# Patient Record
Sex: Male | Born: 1960 | Race: White | Hispanic: No | Marital: Single | Smoking: Never smoker
Health system: Southern US, Community
[De-identification: ages and names within clinical notes are randomized; demographics above are authoritative.]

## PROBLEM LIST (undated history)

## (undated) DIAGNOSIS — K219 Gastro-esophageal reflux disease without esophagitis: Secondary | ICD-10-CM

## (undated) DIAGNOSIS — I10 Essential (primary) hypertension: Secondary | ICD-10-CM

---

## 2008-04-13 HISTORY — PX: OTHER SURGICAL HISTORY: SHX169

## 2017-03-25 ENCOUNTER — Emergency Department (HOSPITAL_COMMUNITY)
Admission: EM | Admit: 2017-03-25 | Discharge: 2017-03-25 | Disposition: A | Discharge status: Other Acute Inpatient Hospital | Payer: Self-pay | Attending: Emergency Medicine | Admitting: Emergency Medicine

## 2017-03-25 ENCOUNTER — Inpatient Hospital Stay (HOSPITAL_COMMUNITY)
Admission: AD | Admit: 2017-03-25 | Discharge: 2017-03-28 | DRG: 897 | Disposition: A | Discharge status: Home / Self Care | Payer: Federal, State, Local not specified - Other | Attending: Psychiatry | Admitting: Psychiatry

## 2017-03-25 ENCOUNTER — Encounter (HOSPITAL_COMMUNITY): Payer: Self-pay

## 2017-03-25 ENCOUNTER — Other Ambulatory Visit: Payer: Self-pay

## 2017-03-25 DIAGNOSIS — Z951 Presence of aortocoronary bypass graft: Secondary | ICD-10-CM | POA: Diagnosis not present

## 2017-03-25 DIAGNOSIS — F332 Major depressive disorder, recurrent severe without psychotic features: Secondary | ICD-10-CM | POA: Diagnosis not present

## 2017-03-25 DIAGNOSIS — F419 Anxiety disorder, unspecified: Secondary | ICD-10-CM | POA: Diagnosis present

## 2017-03-25 DIAGNOSIS — R450 Nervousness: Secondary | ICD-10-CM | POA: Diagnosis not present

## 2017-03-25 DIAGNOSIS — I10 Essential (primary) hypertension: Secondary | ICD-10-CM | POA: Diagnosis present

## 2017-03-25 DIAGNOSIS — F39 Unspecified mood [affective] disorder: Secondary | ICD-10-CM | POA: Diagnosis not present

## 2017-03-25 DIAGNOSIS — I2510 Atherosclerotic heart disease of native coronary artery without angina pectoris: Secondary | ICD-10-CM | POA: Diagnosis present

## 2017-03-25 DIAGNOSIS — Z046 Encounter for general psychiatric examination, requested by authority: Secondary | ICD-10-CM | POA: Insufficient documentation

## 2017-03-25 DIAGNOSIS — G629 Polyneuropathy, unspecified: Secondary | ICD-10-CM | POA: Diagnosis present

## 2017-03-25 DIAGNOSIS — R45851 Suicidal ideations: Secondary | ICD-10-CM | POA: Diagnosis present

## 2017-03-25 DIAGNOSIS — F1024 Alcohol dependence with alcohol-induced mood disorder: Secondary | ICD-10-CM | POA: Diagnosis present

## 2017-03-25 DIAGNOSIS — G4700 Insomnia, unspecified: Secondary | ICD-10-CM | POA: Diagnosis present

## 2017-03-25 DIAGNOSIS — Z7982 Long term (current) use of aspirin: Secondary | ICD-10-CM | POA: Insufficient documentation

## 2017-03-25 DIAGNOSIS — Y908 Blood alcohol level of 240 mg/100 ml or more: Secondary | ICD-10-CM | POA: Diagnosis present

## 2017-03-25 DIAGNOSIS — Z79899 Other long term (current) drug therapy: Secondary | ICD-10-CM | POA: Insufficient documentation

## 2017-03-25 DIAGNOSIS — Z811 Family history of alcohol abuse and dependence: Secondary | ICD-10-CM | POA: Diagnosis not present

## 2017-03-25 DIAGNOSIS — F1010 Alcohol abuse, uncomplicated: Secondary | ICD-10-CM | POA: Diagnosis not present

## 2017-03-25 DIAGNOSIS — F10230 Alcohol dependence with withdrawal, uncomplicated: Secondary | ICD-10-CM | POA: Insufficient documentation

## 2017-03-25 DIAGNOSIS — F1020 Alcohol dependence, uncomplicated: Secondary | ICD-10-CM | POA: Diagnosis not present

## 2017-03-25 DIAGNOSIS — F322 Major depressive disorder, single episode, severe without psychotic features: Secondary | ICD-10-CM | POA: Diagnosis present

## 2017-03-25 HISTORY — DX: Essential (primary) hypertension: I10

## 2017-03-25 HISTORY — DX: Gastro-esophageal reflux disease without esophagitis: K21.9

## 2017-03-25 LAB — URINALYSIS, ROUTINE W REFLEX MICROSCOPIC
Bacteria, UA: NONE SEEN
Bilirubin Urine: NEGATIVE
GLUCOSE, UA: NEGATIVE mg/dL
Ketones, ur: NEGATIVE mg/dL
LEUKOCYTES UA: NEGATIVE
NITRITE: NEGATIVE
PH: 6 (ref 5.0–8.0)
Protein, ur: 100 mg/dL — AB
SPECIFIC GRAVITY, URINE: 1.013 (ref 1.005–1.030)

## 2017-03-25 LAB — COMPREHENSIVE METABOLIC PANEL
ALK PHOS: 72 U/L (ref 38–126)
ALT: 48 U/L (ref 17–63)
ANION GAP: 13 (ref 5–15)
AST: 73 U/L — ABNORMAL HIGH (ref 15–41)
Albumin: 4.1 g/dL (ref 3.5–5.0)
BILIRUBIN TOTAL: 1 mg/dL (ref 0.3–1.2)
BUN: 19 mg/dL (ref 6–20)
CALCIUM: 9.1 mg/dL (ref 8.9–10.3)
CO2: 28 mmol/L (ref 22–32)
Chloride: 97 mmol/L — ABNORMAL LOW (ref 101–111)
Creatinine, Ser: 1.09 mg/dL (ref 0.61–1.24)
GFR calc non Af Amer: >60 mL/min (ref >60)
Glucose, Bld: 99 mg/dL (ref 65–99)
Potassium: 4 mmol/L (ref 3.5–5.1)
SODIUM: 138 mmol/L (ref 135–145)
TOTAL PROTEIN: 8.3 g/dL — AB (ref 6.5–8.1)

## 2017-03-25 LAB — RAPID URINE DRUG SCREEN, HOSP PERFORMED
AMPHETAMINES: NOT DETECTED
Barbiturates: NOT DETECTED
Benzodiazepines: NOT DETECTED
Cocaine: NOT DETECTED
Opiates: NOT DETECTED
TETRAHYDROCANNABINOL: NOT DETECTED

## 2017-03-25 LAB — CBC WITH DIFFERENTIAL/PLATELET
Basophils Absolute: 0 10*3/uL (ref 0.0–0.1)
Basophils Relative: 0 %
EOS ABS: 0.1 10*3/uL (ref 0.0–0.7)
EOS PCT: 1 %
HCT: 42.3 % (ref 39.0–52.0)
HEMOGLOBIN: 14.5 g/dL (ref 13.0–17.0)
LYMPHS ABS: 2.6 10*3/uL (ref 0.7–4.0)
Lymphocytes Relative: 41 %
MCH: 29.6 pg (ref 26.0–34.0)
MCHC: 34.3 g/dL (ref 30.0–36.0)
MCV: 86.3 fL (ref 78.0–100.0)
MONOS PCT: 9 %
Monocytes Absolute: 0.6 10*3/uL (ref 0.1–1.0)
NEUTROS PCT: 49 %
Neutro Abs: 3.1 10*3/uL (ref 1.7–7.7)
Platelets: 157 10*3/uL (ref 150–400)
RBC: 4.9 MIL/uL (ref 4.22–5.81)
RDW: 16.5 % — ABNORMAL HIGH (ref 11.5–15.5)
WBC: 6.4 10*3/uL (ref 4.0–10.5)

## 2017-03-25 LAB — ETHANOL: ALCOHOL ETHYL (B): 321 mg/dL — AB (ref <10)

## 2017-03-25 MED ORDER — ONDANSETRON 4 MG PO TBDP
4.0000 mg | ORAL_TABLET | Freq: Once | ORAL | Status: AC
  Administered 2017-03-25: 4 mg via ORAL
  Filled 2017-03-25: qty 1

## 2017-03-25 MED ORDER — THIAMINE HCL 100 MG/ML IJ SOLN: 100.0000 mg | Freq: Every day | INTRAMUSCULAR | Status: DC

## 2017-03-25 MED ORDER — LORAZEPAM 1 MG PO TABS
0.0000 mg | ORAL_TABLET | Freq: Four times a day (QID) | ORAL | Status: DC
Start: 2017-03-25 — End: 2017-03-25
  Administered 2017-03-25: 2 mg via ORAL
  Administered 2017-03-25: 1 mg via ORAL
  Filled 2017-03-25: qty 1
  Filled 2017-03-25: qty 2

## 2017-03-25 MED ORDER — METOPROLOL TARTRATE 25 MG PO TABS
25.0000 mg | ORAL_TABLET | Freq: Two times a day (BID) | ORAL | Status: DC
  Administered 2017-03-25: 25 mg via ORAL
  Filled 2017-03-25 (×2): qty 1

## 2017-03-25 MED ORDER — LORAZEPAM 2 MG/ML IJ SOLN: 0.0000 mg | Freq: Four times a day (QID) | INTRAMUSCULAR | Status: DC

## 2017-03-25 MED ORDER — HYDROCHLOROTHIAZIDE 25 MG PO TABS: 25.0000 mg | ORAL_TABLET | Freq: Every day | ORAL | Status: DC

## 2017-03-25 MED ORDER — PRAVASTATIN SODIUM 20 MG PO TABS: 20.0000 mg | ORAL_TABLET | Freq: Every day | ORAL | Status: DC

## 2017-03-25 MED ORDER — GABAPENTIN 300 MG PO CAPS
300.0000 mg | ORAL_CAPSULE | Freq: Three times a day (TID) | ORAL | Status: DC
  Administered 2017-03-25 (×2): 300 mg via ORAL
  Filled 2017-03-25 (×2): qty 1

## 2017-03-25 MED ORDER — VITAMIN B-1 100 MG PO TABS
100.0000 mg | ORAL_TABLET | Freq: Every day | ORAL | Status: DC
  Administered 2017-03-25: 100 mg via ORAL
  Filled 2017-03-25: qty 1

## 2017-03-25 MED ORDER — TRAZODONE HCL 50 MG PO TABS: 50.0000 mg | ORAL_TABLET | Freq: Every evening | ORAL | Status: DC | PRN

## 2017-03-25 MED ORDER — ASPIRIN EC 81 MG PO TBEC: 81.0000 mg | DELAYED_RELEASE_TABLET | Freq: Every day | ORAL | Status: DC

## 2017-03-25 MED ORDER — LISINOPRIL 20 MG PO TABS: 20.0000 mg | ORAL_TABLET | Freq: Every day | ORAL | Status: DC

## 2017-03-25 MED ORDER — LORAZEPAM 1 MG PO TABS
0.0000 mg | ORAL_TABLET | Freq: Two times a day (BID) | ORAL | Status: DC
Start: 2017-03-27 — End: 2017-03-25

## 2017-03-25 MED ORDER — LISINOPRIL-HYDROCHLOROTHIAZIDE 20-25 MG PO TABS: 1.0000 | ORAL_TABLET | Freq: Every day | ORAL | Status: DC

## 2017-03-25 MED ORDER — LORAZEPAM 2 MG/ML IJ SOLN: 0.0000 mg | Freq: Two times a day (BID) | INTRAMUSCULAR | Status: DC

## 2017-03-25 NOTE — [facility] Counselor (Signed)
Pt states that he does not want to stay and needs to get home to his dog. He admits to calling his primary care doctor this AM and stating that he was suicidal with a plan to kill himself with a gun that he has at his home. Pt states that he doesn't feel suicidal anymore but does feel he needs help. He has recently relpased on alcohol for the past 2 weeks. Pt meets criteria for IVC due to the following suicide risk factors- age, race, access to a gun, stating suicidal ideation, substance abuse and living environment (pt lives alone). Dr. Adriana Simasook has been consulted about the pt and agrees to put IVC in place. Pt meets inpatient criteria per Nanine MeansJamison Lord NP.   Kateri PlummerKristin Timmothy Baranowski, LCAS, [facility] Lead Triage Specialist  [facility]  Therapeutic Triage Services Phone: [phone] Fax: [phone]

## 2017-03-25 NOTE — ED Notes (Signed)
Bed: WLPT4 Expected date:  Expected time:  Means of arrival:  Comments: 

## 2017-03-25 NOTE — ED Notes (Addendum)
Pt oriented to room and unit.  Pt is calm and cooperative.  He is pleasant.  Patient denies S/I and H/I and AVH.  Pt is showing no signs of withdrawal at this time.  15 minute checks and video monitoring in place.

## 2017-03-25 NOTE — ED Notes (Signed)
Patient will go back to psych ED when changed out.

## 2017-03-25 NOTE — ED Notes (Signed)
Pt sleeping at present, easily arouseable to verbal stimuli.  A&O x 3, monitoring for safety, Q 15 min checks in effect.  [facility] and [facility] after 9pm.

## 2017-03-25 NOTE — ED Notes (Signed)
Report called to RN Erskine SquibbJane, [facility].  Pending [employer] transport.

## 2017-03-25 NOTE — BH Assessment (Signed)
[facility] Assessment Progress Note  Per Nanine MeansJamison Lord, DNP, this pt requires psychiatric hospitalization.  Berneice Heinrichina Tate, RN, [facility]; they will be ready to receive pt at 21:00.  Pt presents under IVC initiated by EDP Donnetta HutchingBrian Cook, MD, and IVC documents have [facility].  Pt's nurse, Kendal Hymendie, has been notified, and agrees to call report to [phone].  Pt is to be transported via [employer] examiner[employer] enforcement.   Doylene Canninghomas Kendall Justo, MA Triage Specialist [phone]

## 2017-03-25 NOTE — ED Notes (Signed)
Date and time results received: 03/25/17 12:46 PM  Test: alcohol  Critical Value: 321  Name of Provider Notified: Adriana Simasook MD  Orders Received? Or Actions Taken?: awaiting orders

## 2017-03-25 NOTE — BH Assessment (Signed)
Assessment Note  Benjamin Richard is an 56 y.o. male  Who came in by police after calling his primary care doctor this AM and stating that he was suicidal with a plan to kill himself with a gun that he has at his home. Pt states that he doesn't feel suicidal anymore but does feel he needs help. Pt BAL is currently 321 but is coherent with pleasant affect. Pt states that he has recently relpased on alcohol for the past 2 weeks and has a history of alcoholism for years. Pt wanted to leave during assessment because he is worried about his dog being at home alone. Pt had to be IVCd by Dr. Adriana Simasook because he said he could "just walk out the door". Pt meets criteria for IVC due to the following suicide risk factors- age, race, access to a gun, stating suicidal ideation, substance abuse and living environment (pt lives alone).  Pt states that his current stressor [place]. Pt states that he lives alone in [place] and has no supports. 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 Pt states that he has trouble falling asleep and self medicates with alcohol during the week. He states that he used to go to AA but feels it makes him more depressed. Pt states that he would like to start going to outpatient counseling and build a support system.  Pt denies HI, AVH or any other psychiatric issues. He has never been admitted inpatient in the past but has gotten substance abuse treatment. Pt states that he wanted to get help sooner but doesn't have insurance until January 2019.   Nanine MeansJamison Lord NP recommends inpatient admission. IVC completed.   Diagnosis: F33.2 Major Depressive Disorder Single Episode Severe, F10.20 Alcohol Use Disorder Severe   Past Medical History: History reviewed. No pertinent past medical history.  History reviewed. No pertinent surgical history.  Family History: History reviewed. No  pertinent family history.  Social History:  reports that  has never smoked. he has never used smokeless tobacco. He reports that he drinks alcohol. He reports that he does not use drugs.  Additional Social History:  Alcohol / Drug Use History of alcohol / drug use?: Yes Substance #1 Name of Substance 1: ALCOHOL  1 - Age of First Use: UNKNOWN 1 - Amount (size/oz): 2 1/2 GALLONS A WEEK  1 - Frequency: DAILY  1 - Duration: 2 WEEKS  1 - Last Use / Amount: YESTERDAY   CIWA: CIWA-Ar BP: 137/82 Pulse Rate: 86 Nausea and Vomiting: no nausea and no vomiting Tactile Disturbances: none Tremor: no tremor Auditory Disturbances: not present Paroxysmal Sweats: no sweat visible Visual Disturbances: not present Anxiety: mildly anxious Headache, Fullness in Head: none present Agitation: normal activity Orientation and Clouding of Sensorium: oriented and can do serial additions CIWA-Ar Total: 1 COWS:    Allergies: Not on File  Home Medications:  (Not in a hospital admission)  OB/GYN Status:  No LMP for male patient.  General Assessment Data [facility] of Assessment: [facility] TTS Assessment: In system Is this a Tele or Face-to-Face Assessment?: Face-to-Face Is this an Initial Assessment or a Re-assessment for this encounter?: Initial Assessment Marital status: Long term relationship Is patient pregnant?: No Pregnancy Status: No Living Arrangements: Alone Can pt return to current living arrangement?: Yes Admission Status: Involuntary Is patient capable of signing voluntary admission?: No Referral Source: Self/Family/Friend Insurance type:  Self Pay      Crisis Care Plan Living Arrangements: Alone Legal Guardian: Mother  Education Status Is patient currently in school?: No  Risk to self with the past 6 months Suicidal Ideation: Yes-Currently Present Has patient been a risk to self within the past 6 months prior to admission? : Yes Suicidal Intent: Yes-Currently Present Has  patient had any suicidal intent within the past 6 months prior to admission? : Yes Is patient at risk for suicide?: Yes Suicidal Plan?: Yes-Currently Present Has patient had any suicidal plan within the past 6 months prior to admission? : Yes Specify Current Suicidal Plan: Pt has a gun at his home and stated that he was thinking of using it Access to Means: Yes Specify Access to Suicidal Means: pt has a gun at his home What has been your use of drugs/alcohol within the last 12 months?: uses alcohol  Previous Attempts/Gestures: No How many times?: 0 Other Self Harm Risks: NA Intentional Self Injurious Behavior: None Family Suicide History: Unable to assess Recent stressful life event(s): Other (Comment)(lives alone) Persecutory voices/beliefs?: No Depression: Yes Depression Symptoms: Despondent, Feeling worthless/self pity Substance abuse history and/or treatment for substance abuse?: No Suicide prevention information given to non-admitted patients: Yes  Risk to Others within the past 6 months Homicidal Ideation: No Does patient have any lifetime risk of violence toward others beyond the six months prior to admission? : No Thoughts of Harm to Others: No Current Homicidal Intent: No Current Homicidal Plan: No Access to Homicidal Means: No Identified Victim: None History of harm to others?: No Assessment of Violence: None Noted Violent Behavior Description: none Does patient have access to weapons?: No Criminal Charges Pending?: No Does patient have a court date: No Is patient on probation?: No  Psychosis Hallucinations: None noted Delusions: None noted  Mental Status Report Appearance/Hygiene: Unremarkable Eye Contact: Good Motor Activity: Freedom of movement Speech: Logical/coherent Level of Consciousness: Alert Mood: Pleasant Affect: Depressed Anxiety Level: Moderate Thought Processes: Coherent Judgement: Impaired Orientation: Person, Place, Time,  Situation Obsessive Compulsive Thoughts/Behaviors: None  Cognitive Functioning Concentration: Normal Memory: Recent Intact, Remote Intact IQ: Average Insight: Poor Impulse Control: Fair Appetite: Fair Weight Loss: 0 Weight Gain: 0 Sleep: No Change Total Hours of Sleep: 8 Vegetative Symptoms: None  ADLScreening [facility] Assessment Services) Patient's cognitive ability adequate to safely complete daily activities?: Yes Patient able to express need for assistance with ADLs?: Yes Independently performs ADLs?: Yes (appropriate for developmental age)  Prior Inpatient Therapy Prior Inpatient Therapy: No  Prior Outpatient Therapy Prior Outpatient Therapy: Yes Does patient have an ACCT team?: No Does patient have Intensive In-House Services?  : No Does patient have [facility] services? : No Does patient have P4CC services?: No  ADL Screening (condition at time of admission) Patient's cognitive ability adequate to safely complete daily activities?: Yes Is the patient deaf or have difficulty hearing?: No Does the patient have difficulty seeing, even when wearing glasses/contacts?: No Does the patient have difficulty concentrating, remembering, or making decisions?: No Patient able to express need for assistance with ADLs?: Yes Does the patient have difficulty dressing or bathing?: No Independently performs ADLs?: Yes (appropriate for developmental age) Does the patient have difficulty walking or climbing stairs?: No Weakness of Legs: None Weakness of Arms/Hands: None  Home Assistive Devices/Equipment Home Assistive Devices/Equipment: None  Therapy Consults (therapy consults require a physician order) PT Evaluation Needed: No OT Evalulation Needed: No SLP Evaluation Needed: No Abuse/Neglect Assessment (Assessment to be complete while patient is alone)  Abuse/Neglect Assessment Can Be Completed: Unable to assess, patient is non-responsive or altered mental status Values /  Beliefs Cultural Requests During Hospitalization: None Spiritual Requests During Hospitalization: None Consults Spiritual Care Consult Needed: No Social Work Consult Needed: No Merchant navy officerAdvance Directives (For Healthcare) Does Patient Have a Medical Advance Directive?: No Would patient like information on creating a medical advance directive?: No - Patient declined Nutrition Screen- [facility] Adult/[facility] Patient's home diet: Regular Has the patient recently lost weight without trying?: No Has the patient been eating poorly because of a decreased appetite?: No Malnutrition Screening Tool Score: 0  Additional Information 1:1 In Past 12 Months?: No CIRT Risk: No Elopement Risk: No Does patient have medical clearance?: Yes     Disposition:  Disposition Initial Assessment Completed for this Encounter: Yes Disposition of Patient: Inpatient treatment program Type of inpatient treatment program: Adult  On Site Evaluation by:  Donetta PottsKristin Estera Ozier LPC, LCAS Reviewed with Physician:  Nanine MeansJamison Lord NP   Lanice ShirtsKristin M Ariauna Farabee LPC, LCAS  03/25/2017 2:16 PM

## 2017-03-25 NOTE — ED Notes (Signed)
Pt vomited x 2.  Comfort measures given.  Report attempted x 1. [facility] to return call.

## 2017-03-25 NOTE — ED Triage Notes (Signed)
[place] reports Pt called nurse at MD's office with statements of suicidal ideation and depression, nurse then called PD for response and transport for Phych eval. PD reports no suicidal ideation upon interview, Pt stated he is seeking depression medication.

## 2017-03-25 NOTE — ED Notes (Signed)
Bed: [facility] Expected date:  Expected time:  Means of arrival:  Comments: ETOH 

## 2017-03-25 NOTE — ED Provider Notes (Signed)
[facility]-EMERGENCY DEPT Provider Note   CSN: [id number] Arrival date & time: 03/25/17  1007     History   Chief Complaint Chief Complaint  Patient presents with  . Suicidal    HPI Benjamin Richard is a 56 y.o. male past medical history of CAD, presenting to the ED with 3 weeks of worsening depression.  States symptoms acutely worsened for the past few days since he was stuck in the house due to the snow.  Reports the past few days he began feeling suicidal and called the suicidal hotline, who called [place] [employer] brought him here.  Patient endorses suicidal ideation with a plan to use a gun to end his life, which he "may or may not have" at home.  Auditory or visual hallucinations.  States he is an every day drinker of 6-7 liquor drinks, and drank today.  Denies smoking or illicit drug use.  No medical complaints today.  Seeking medication for his depression and plans to go home today to take care of his dog.  The history is provided by the patient.    History reviewed. No pertinent past medical history.  There are no active problems to display for this patient.   History reviewed. No pertinent surgical history.     Home Medications    Prior to Admission medications   Medication Sig Start Date End Date Taking? Authorizing Provider  aspirin EC 81 MG tablet Take 81 mg by mouth daily.   Yes [provider]  gabapentin (NEURONTIN) 300 MG capsule Take 300 mg by mouth 3 (three) times daily.   Yes [provider]  lisinopril-hydrochlorothiazide (PRINZIDE,ZESTORETIC) 20-25 MG tablet Take 1 tablet by mouth daily.   Yes [provider]  metoprolol tartrate (LOPRESSOR) 25 MG tablet Take 25 mg by mouth 2 (two) times daily.   Yes [provider]  pravastatin (PRAVACHOL) 20 MG tablet Take 20 mg by mouth daily.   Yes [provider]  traZODone (DESYREL) 50 MG tablet Take 50 mg by mouth at bedtime as needed for sleep.   Yes [provider]    Family History History reviewed. No pertinent family history.  Social History Social History   Tobacco Use  . Smoking status: Never Smoker  . Smokeless tobacco: Never Used  Substance Use Topics  . Alcohol use: Yes  . Drug use: No     Allergies   Patient has no allergy information on record.   Review of Systems Review of Systems  Constitutional: Negative for fever.  Psychiatric/Behavioral: Positive for dysphoric mood and suicidal ideas. Negative for hallucinations. The patient is not nervous/anxious.   All other systems reviewed and are negative.    Physical Exam Updated Vital Signs BP 137/82 (BP Location: Right Arm)   Pulse 86   Temp 98.4 F (36.9 C) (Oral)   Resp 14   Ht 6\' 5"  (1.956 m)   Wt 124.7 kg (275 lb)   SpO2 100%   BMI 32.61 kg/m   Physical Exam  Constitutional: He is oriented to person, place, and time. He appears well-developed and well-nourished. No distress.  Alcohol on breath, but clinically sober on exam. Ambulating without difficulty. Alert, following commands.  HENT:  Head: Normocephalic and atraumatic.  Mouth/Throat: Oropharynx is clear and moist.  Eyes: Conjunctivae are normal.  Cardiovascular: Normal rate, regular rhythm, normal heart sounds and intact distal pulses.  Pulmonary/Chest: Effort normal and breath sounds normal.  Abdominal: Soft. Bowel sounds are normal.  Neurological: He  is alert and oriented to person, place, and time.  Mental Status:  Alert, oriented, thought content appropriate, able to give a coherent history. Speech fluent without evidence of aphasia. Able to follow 2 step commands without difficulty.  Cranial Nerves:  II:  Peripheral visual fields grossly normal, pupils equal, round, reactive to light III,IV, VI: ptosis not present, extra-ocular motions intact bilaterally  V,VII: smile symmetric, facial light touch sensation equal VIII: hearing grossly normal to voice  X: uvula elevates symmetrically   XI: bilateral shoulder shrug symmetric and strong XII: midline tongue extension without fassiculations Motor:  Normal tone. 5/5 in upper and lower extremities bilaterally including strong and equal grip strength and dorsiflexion/plantar flexion Sensory: Pinprick and light touch normal in all extremities.  Deep Tendon Reflexes: 2+ and symmetric in the biceps and patella Cerebellar: normal finger-to-nose with bilateral upper extremities Gait: normal gait and balance CV: distal pulses palpable throughout    Skin: Skin is warm.  Psychiatric: He has a normal mood and affect. His speech is normal and behavior is normal. He is not actively hallucinating. He expresses suicidal ideation. He expresses no homicidal ideation. He expresses suicidal plans. He expresses no homicidal plans.  Nursing note and vitals reviewed.    ED Treatments / Results  Labs (all labs ordered are listed, but only abnormal results are displayed) Labs Reviewed  CBC WITH DIFFERENTIAL/PLATELET - Abnormal; Notable for the following components:      Result Value   RDW 16.5 (*)    All other components within normal limits  COMPREHENSIVE METABOLIC PANEL - Abnormal; Notable for the following components:   Chloride 97 (*)    Total Protein 8.3 (*)    AST 73 (*)    All other components within normal limits  URINALYSIS, ROUTINE W REFLEX MICROSCOPIC - Abnormal; Notable for the following components:   Hgb urine dipstick SMALL (*)    Protein, ur 100 (*)    Squamous Epithelial / LPF 0-5 (*)    All other components within normal limits  ETHANOL - Abnormal; Notable for the following components:   Alcohol, Ethyl (B) 321 (*)    All other components within normal limits  RAPID URINE DRUG SCREEN, HOSP PERFORMED    EKG  EKG Interpretation None       Radiology No results found.  Procedures Procedures (including critical care time)  Medications Ordered in ED Medications  LORazepam (ATIVAN) injection 0-4 mg (0 mg  Intravenous Hold 03/25/17 1350)    Or  LORazepam (ATIVAN) tablet 0-4 mg ( Oral See Alternative 03/25/17 1350)  LORazepam (ATIVAN) injection 0-4 mg (not administered)    Or  LORazepam (ATIVAN) tablet 0-4 mg (not administered)  thiamine (VITAMIN B-1) tablet 100 mg (not administered)    Or  thiamine (B-1) injection 100 mg (not administered)     Initial Impression / Assessment and Plan / ED Course  I have reviewed the triage vital signs and the nursing notes.  Pertinent labs & imaging results that were available during my care of the patient were reviewed by me and considered in my medical decision making (see chart for details).     Pt presenting with SI with plan, x 3 weeks. Hx of alcohol abuse, pt is everyday drinker of 6-7 liquor drinks per day. Denies illicit drug use. No medical complaints today. Normal neuro exam. Clinically sober on exam. Pt is medically cleared for TTS consult.  IVC issued, as pt requesting to leave but with suicide risk factors, including expressing SI  with plan to use a gun which he has access to at home. Pt also intoxicated.  Patient discussed with and seen by Dr. Adriana Simasook  Final Clinical Impressions(s) / ED Diagnoses   Final diagnoses:  Suicidal ideation    ED Discharge Orders    None       Adryan Shin, [place] N, PA-C 03/25/17 1359    Donnetta Hutchingook, Brian, MD 03/26/17 1150

## 2017-03-25 NOTE — Patient Outreach (Signed)
ED Peer Support Specialist Patient Intake (Complete at intake & 30-60 Day Follow-up)  Name: Benjamin Richard  MRN: [id number]  Age: 56 y.o.   Date of Admission: 03/25/2017  Intake: Initial Comments:      Primary Reason Admitted: IVC, SI, alcohol use  Lab values: Alcohol/ETOH: Positive Positive UDS? No Amphetamines:   Barbiturates:   Benzodiazepines:   Cocaine:   Opiates:   Cannabinoids:    Demographic information: Gender: Male Ethnicity: White Marital Status: Other (comment)(Long-term relationship) Insurance Status: Uninsured/Self-pay(Will have insurance through his job starting in January.) Control and instrumentation engineereceives non-medical governmental assistance (Work Engineer, agriculturalirst/Welfare, Sales executivefood stamps, etc.: No Lives with: Alone Living situation: House/Apartment  Reported Patient History: Patient reported health conditions: Depression Patient aware of HIV and hepatitis status: Yes (comment)(Negative)  In past year, has patient visited ED for any reason? No  Number of ED visits:    Reason(s) for visit:    In past year, has patient been hospitalized for any reason? No  Number of hospitalizations:    Reason(s) for hospitalization:    In past year, has patient been arrested? No  Number of arrests:    Reason(s) for arrest:    In past year, has patient been incarcerated? No  Number of incarcerations:    Reason(s) for incarceration:    In past year, has patient received medication-assisted treatment? No  In past year, patient received the following treatments: Peer support group(AA)  In past year, has patient received any harm reduction services? No  Did this include any of the following?    In past year, has patient received care from a mental health provider for diagnosis other than SUD? No  In past year, is this first time patient has overdosed? (Has not overdosed)  Number of past overdoses:    In past year, is this first time patient has been hospitalized for an overdose? (has not  overdosed)  Number of hospitalizations for overdose(s):    Is patient currently receiving treatment for a mental health diagnosis? No  Patient reports experiencing difficulty participating in SUD treatment: No    Most important reason(s) for this difficulty?    Has patient received prior services for treatment? No  In past, patient has received services from following agencies:    Plan of Care:  Suggested follow up at these agencies/treatment centers: Other (comment)(Patient is interested in more of outpatient for substance use treatment. Patient wants more of an individual approach for substance use treatment. [facility] will provide resource of [facility]. )  Other information:    Bartholomew BoardsJohn Josiane Labine, CPSS  03/25/2017 4:02 PM

## 2017-03-26 ENCOUNTER — Other Ambulatory Visit: Payer: Self-pay

## 2017-03-26 ENCOUNTER — Encounter (HOSPITAL_COMMUNITY): Payer: Self-pay | Admitting: *Deleted

## 2017-03-26 DIAGNOSIS — F332 Major depressive disorder, recurrent severe without psychotic features: Secondary | ICD-10-CM

## 2017-03-26 DIAGNOSIS — Z811 Family history of alcohol abuse and dependence: Secondary | ICD-10-CM

## 2017-03-26 DIAGNOSIS — F1010 Alcohol abuse, uncomplicated: Secondary | ICD-10-CM

## 2017-03-26 MED ORDER — HYDROXYZINE HCL 25 MG PO TABS
25.0000 mg | ORAL_TABLET | Freq: Four times a day (QID) | ORAL | Status: DC | PRN
  Filled 2017-03-26: qty 10

## 2017-03-26 MED ORDER — LORAZEPAM 1 MG PO TABS: 1.0000 mg | ORAL_TABLET | Freq: Every day | ORAL | Status: DC

## 2017-03-26 MED ORDER — PRAVASTATIN SODIUM 10 MG PO TABS
20.0000 mg | ORAL_TABLET | Freq: Every day | ORAL | Status: DC
  Administered 2017-03-26 – 2017-03-28 (×3): 20 mg via ORAL
  Filled 2017-03-26 (×2): qty 1
  Filled 2017-03-26 (×2): qty 2
  Filled 2017-03-26: qty 1
  Filled 2017-03-26: qty 2

## 2017-03-26 MED ORDER — VITAMIN B-1 100 MG PO TABS
100.0000 mg | ORAL_TABLET | Freq: Every day | ORAL | Status: DC
  Administered 2017-03-26 – 2017-03-28 (×3): 100 mg via ORAL
  Filled 2017-03-26 (×5): qty 1

## 2017-03-26 MED ORDER — ONDANSETRON 4 MG PO TBDP
4.0000 mg | ORAL_TABLET | Freq: Four times a day (QID) | ORAL | Status: DC | PRN
  Administered 2017-03-26 (×2): 4 mg via ORAL
  Filled 2017-03-26 (×2): qty 1

## 2017-03-26 MED ORDER — TRAZODONE HCL 50 MG PO TABS
50.0000 mg | ORAL_TABLET | Freq: Every evening | ORAL | Status: DC | PRN
  Administered 2017-03-26 – 2017-03-27 (×3): 50 mg via ORAL
  Filled 2017-03-26 (×2): qty 1
  Filled 2017-03-26: qty 7
  Filled 2017-03-26: qty 1

## 2017-03-26 MED ORDER — LORAZEPAM 2 MG/ML IJ SOLN: 0.0000 mg | Freq: Two times a day (BID) | INTRAMUSCULAR | Status: DC

## 2017-03-26 MED ORDER — ASPIRIN EC 81 MG PO TBEC
81.0000 mg | DELAYED_RELEASE_TABLET | Freq: Every day | ORAL | Status: DC
  Administered 2017-03-26 – 2017-03-28 (×3): 81 mg via ORAL
  Filled 2017-03-26 (×5): qty 1

## 2017-03-26 MED ORDER — MAGNESIUM HYDROXIDE 400 MG/5ML PO SUSP: 30.0000 mL | Freq: Every day | ORAL | Status: DC | PRN

## 2017-03-26 MED ORDER — GABAPENTIN 100 MG PO CAPS
100.0000 mg | ORAL_CAPSULE | Freq: Three times a day (TID) | ORAL | Status: DC
  Administered 2017-03-26 – 2017-03-27 (×2): 100 mg via ORAL
  Filled 2017-03-26 (×5): qty 1

## 2017-03-26 MED ORDER — HYDROCHLOROTHIAZIDE 12.5 MG PO CAPS
12.5000 mg | ORAL_CAPSULE | Freq: Two times a day (BID) | ORAL | Status: DC
  Administered 2017-03-26 – 2017-03-28 (×5): 12.5 mg via ORAL
  Filled 2017-03-26 (×11): qty 1

## 2017-03-26 MED ORDER — LORAZEPAM 1 MG PO TABS
1.0000 mg | ORAL_TABLET | Freq: Two times a day (BID) | ORAL | Status: DC
  Filled 2017-03-26 (×2): qty 1

## 2017-03-26 MED ORDER — LORAZEPAM 1 MG PO TABS
1.0000 mg | ORAL_TABLET | Freq: Four times a day (QID) | ORAL | Status: DC | PRN
  Administered 2017-03-26: 1 mg via ORAL
  Filled 2017-03-26: qty 1

## 2017-03-26 MED ORDER — GABAPENTIN 300 MG PO CAPS
300.0000 mg | ORAL_CAPSULE | Freq: Three times a day (TID) | ORAL | Status: DC
  Administered 2017-03-26 (×2): 300 mg via ORAL
  Filled 2017-03-26 (×7): qty 1

## 2017-03-26 MED ORDER — LORAZEPAM 1 MG PO TABS: 0.0000 mg | ORAL_TABLET | Freq: Two times a day (BID) | ORAL | Status: DC

## 2017-03-26 MED ORDER — LORAZEPAM 1 MG PO TABS
1.0000 mg | ORAL_TABLET | Freq: Four times a day (QID) | ORAL | Status: AC
  Administered 2017-03-26 (×4): 1 mg via ORAL
  Filled 2017-03-26 (×4): qty 1

## 2017-03-26 MED ORDER — ACETAMINOPHEN 325 MG PO TABS: 650.0000 mg | ORAL_TABLET | Freq: Four times a day (QID) | ORAL | Status: DC | PRN

## 2017-03-26 MED ORDER — METOPROLOL TARTRATE 25 MG PO TABS
25.0000 mg | ORAL_TABLET | Freq: Two times a day (BID) | ORAL | Status: DC
  Administered 2017-03-26 – 2017-03-28 (×5): 25 mg via ORAL
  Filled 2017-03-26 (×9): qty 1

## 2017-03-26 MED ORDER — LOPERAMIDE HCL 2 MG PO CAPS: 2.0000 mg | ORAL_CAPSULE | ORAL | Status: DC | PRN

## 2017-03-26 MED ORDER — LORAZEPAM 1 MG PO TABS: 0.0000 mg | ORAL_TABLET | Freq: Four times a day (QID) | ORAL | Status: DC

## 2017-03-26 MED ORDER — THIAMINE HCL 100 MG/ML IJ SOLN: 100.0000 mg | Freq: Every day | INTRAMUSCULAR | Status: DC

## 2017-03-26 MED ORDER — LORAZEPAM 2 MG/ML IJ SOLN: 0.0000 mg | Freq: Four times a day (QID) | INTRAMUSCULAR | Status: DC

## 2017-03-26 MED ORDER — ALUM & MAG HYDROXIDE-SIMETH 200-200-20 MG/5ML PO SUSP: 30.0000 mL | ORAL | Status: DC | PRN

## 2017-03-26 MED ORDER — LORAZEPAM 1 MG PO TABS
1.0000 mg | ORAL_TABLET | Freq: Three times a day (TID) | ORAL | Status: AC
  Administered 2017-03-27 (×3): 1 mg via ORAL
  Filled 2017-03-26 (×3): qty 1

## 2017-03-26 MED ORDER — LISINOPRIL 10 MG PO TABS
10.0000 mg | ORAL_TABLET | Freq: Two times a day (BID) | ORAL | Status: DC
  Administered 2017-03-26 – 2017-03-28 (×5): 10 mg via ORAL
  Filled 2017-03-26 (×5): qty 1
  Filled 2017-03-26: qty 2
  Filled 2017-03-26 (×4): qty 1

## 2017-03-26 NOTE — Plan of Care (Signed)
Patient has not engaged in self harm, denies SI.   Patient verbalizes understanding of information, education provided.

## 2017-03-26 NOTE — Progress Notes (Signed)
D: When asked about his day pt stated he had a "good day", held out his arms so writer could see he didn't have tremors. When asked if he had a chance to speak to the doctor, pt stated, "I think they're keeping me too long". Stated, he was told he may leave Mon or Tues.  Stated, "I haven't needed zofran today". Pt has no questions or concerns.    A:  Support and encouragement was offered. 15 min checks continued for safety.  R: Pt remains safe.

## 2017-03-26 NOTE — [facility] Suicide Risk Assessment (Signed)
[facility]   Nursing information obtained from:  Patient Demographic factors:  Male, Caucasian, Access to firearms, Divorced or widowed, Living alone Current Mental Status:  NA Loss Factors:  Financial problems / change in socioeconomic status, Loss of significant relationship Historical Factors:  NA Risk Reduction Factors:  Sense of responsibility to family, Employed, Religious beliefs about death  Total Time spent with patient: 45 minutes Principal Problem:  Alcohol Dependence, Alcohol Induced Mood Disorder, Depressed  Diagnosis:   Patient Active Problem List   Diagnosis Date Noted  . Alcohol dependence (HCC) [F10.20] 03/25/2017  . Major depressive disorder, recurrent severe without psychotic features (HCC) [F33.2] 03/25/2017    Continued Clinical Symptoms:  Alcohol Use Disorder Identification Test Final Score (AUDIT): 25 The "Alcohol Use Disorders Identification Test", Guidelines for Use in Primary Care, Second Edition.  World Science writerHealth Organization [facility](WHO). Score between 0-7:  no or low risk or alcohol related problems. Score between 8-15:  moderate risk of alcohol related problems. Score between 16-19:  high risk of alcohol related problems. Score 20 or above:  warrants further diagnostic evaluation for alcohol dependence and treatment.   CLINICAL FACTORS:  [age] old male, divorced, lives alone, employed , has two adult step children. Recently relocated due to employment about 2-3 months ago, which has been challenging in that his GF is no longer geographically close to him.  Reports he called a suicide hotline due to worsening depression and suicidal ideations , with thoughts of shooting self . The police were called and was brought to ED yesterday. Describes some neuro-vegetative symptoms- decreased appetite, some anhedonia. Denies any hallucinations or psychotic symptoms. Patient reports heavy , daily alcohol consumption- admission BAL 321, UDS negative  .  He reports a history of alcohol use disorder, had relapsed about two months ago after a period of several months of sobriety. States he drinks close to two gallons of liquor per week. Denies any drug abuse . Denies history of DTs or WDL seizures. No history of blackouts, DUI x 1 in 2015.   One prior psychiatric admission for alcohol dependence and detox . He has never attempted suicide. Denies history of mania, denies history of psychosis.  Medical History of HTN, states he was told he had DM in the past, but serial HgbA1Cs have been WNL. History of CAD, S/P bypass surgery   Father deceased from pancreatic cancer, he had history of alcohol dependence, mother alive . No history of mental illness or of su  Dx- Alcohol Dependence, Alcohol Induced Mood Disorder, Depressed   Plan- Inpatient admission. Start Ativan detox protocol to address alcohol withdrawal ( reports he has been on Librium and on Ativan for detox in the past, prefers the latter ) . We discussed starting antidepressant medication - prefers to monitor mood in the context of sobriety and consider antidepressant if mood does not continue to improve with sobriety.     Musculoskeletal: Strength & Muscle Tone: within normal limits mild distal tremors and facial flushing , no visual disturbances,no psychomotor agitation or restlessness  Gait & Station: normal Patient leans: N/A  Psychiatric Specialty Exam: Physical Exam  ROS denies headache , no chest pain, no shortness of breath, no vomiting, no melenas .  Blood pressure (!) 150/102, pulse (!) 105, temperature 98 F (36.7 C), resp. rate 16, height 6\' 5"  (1.956 m), weight 122 kg (269 lb).Body mass index is 31.9 kg/m.  General Appearance: Fairly Groomed  Eye Contact:  Good  Speech:  Normal Rate  Volume:  Normal  Mood:  states he is feeling better, and describes " I feel a lot better today"  Affect:  appropriate, reactive   Thought Process:  Linear and Descriptions of  Associations: Intact  Orientation:  Other:  fully alert and attentive   Thought Content:  denies hallucinations, no delusions, not internally preoccupied   Suicidal Thoughts:  No denies suicidal or self injurious ideations at this time, denies homicidal or violent ideations  Homicidal Thoughts:  No  Memory:  recent and remote grossly intact   Judgement:  Other:  improving  Insight:  fair- improving   Psychomotor Activity:  Normal- no restlessness or agitation, mild distal tremors   Concentration:  Concentration: Good and Attention Span: Good  Recall:  Good  Fund of Knowledge:  Good  Language:  Good  Akathisia:  Negative  Handed:  Right  AIMS (if indicated):     Assets:  Communication Skills Desire for Improvement Resilience  ADL's:  Intact  Cognition:  WNL  Sleep:  Number of Hours: 3.25      COGNITIVE FEATURES THAT CONTRIBUTE TO RISK:  Closed-mindedness and Loss of executive function    SUICIDE RISK:   Moderate:  Frequent suicidal ideation with limited intensity, and duration, some specificity in terms of plans, no associated intent, good self-control, limited dysphoria/symptomatology, some risk factors present, and identifiable protective factors, including available and accessible social support.  PLAN OF CARE: Patient will be admitted to inpatient psychiatric unit for stabilization and safety. Will provide and encourage milieu participation. Provide medication management and maked adjustments as needed.  Will also provide medication management to address potential alcohol WDL. Will follow daily.    I certify that inpatient services furnished can reasonably be expected to improve the patient's condition.   Craige CottaFernando A Cobos, MD Sep 09, 202018, 1:50 PM

## 2017-03-26 NOTE — Tx Team (Signed)
Interdisciplinary Treatment and Diagnostic Plan Update  05/16/202018 Time of Session: [name] MRN: [id number]  Principal Diagnosis: Major Depressive Disorder, recurrent, severe without psychotic features  Secondary Diagnoses: Active Problems:   Major depressive disorder, recurrent severe without psychotic features ([place])   Current Medications:  Current Facility-Administered Medications  Medication Dose Route Frequency Provider Last Rate Last Dose  . acetaminophen (TYLENOL) tablet 650 mg  650 mg Oral Q6H PRN Patrecia Pour, NP      . alum & mag hydroxide-simeth (MAALOX/MYLANTA) 200-200-20 MG/5ML suspension 30 mL  30 mL Oral Q4H PRN Patrecia Pour, NP      . aspirin EC tablet 81 mg  81 mg Oral Daily Lord, Jamison Y, NP      . gabapentin (NEURONTIN) capsule 300 mg  300 mg Oral TID Patrecia Pour, NP      . hydrOXYzine (ATARAX/VISTARIL) tablet 25 mg  25 mg Oral Q6H PRN Lindon Romp A, NP      . loperamide (IMODIUM) capsule 2-4 mg  2-4 mg Oral PRN Lindon Romp A, NP      . LORazepam (ATIVAN) tablet 1 mg  1 mg Oral Q6H PRN Lindon Romp A, NP   1 mg at 03/26/17 0158  . LORazepam (ATIVAN) tablet 1 mg  1 mg Oral QID Rozetta Nunnery, NP       Followed by  . [START ON 03/27/2017] LORazepam (ATIVAN) tablet 1 mg  1 mg Oral TID Rozetta Nunnery, NP       Followed by  . [START ON 03/28/2017] LORazepam (ATIVAN) tablet 1 mg  1 mg Oral BID Rozetta Nunnery, NP       Followed by  . [START ON 03/29/2017] LORazepam (ATIVAN) tablet 1 mg  1 mg Oral Daily Lindon Romp A, NP      . magnesium hydroxide (MILK OF MAGNESIA) suspension 30 mL  30 mL Oral Daily PRN Patrecia Pour, NP      . metoprolol tartrate (LOPRESSOR) tablet 25 mg  25 mg Oral BID Patrecia Pour, NP      . ondansetron (ZOFRAN-ODT) disintegrating tablet 4 mg  4 mg Oral Q6H PRN Lindon Romp A, NP   4 mg at 03/26/17 0158  . pravastatin (PRAVACHOL) tablet 20 mg  20 mg Oral Daily Lord, Jamison Y, NP      . thiamine (VITAMIN B-1) tablet 100  mg  100 mg Oral Daily Patrecia Pour, NP       Or  . thiamine (B-1) injection 100 mg  100 mg Intravenous Daily Waylan Boga Y, NP      . traZODone (DESYREL) tablet 50 mg  50 mg Oral QHS PRN Patrecia Pour, NP   50 mg at 03/26/17 0157   PTA Medications: Medications Prior to Admission  Medication Sig Dispense Refill Last Dose  . aspirin EC 81 MG tablet Take 81 mg by mouth daily.   03/25/2017 at Unknown time  . gabapentin (NEURONTIN) 300 MG capsule Take 300 mg by mouth 3 (three) times daily.   03/25/2017 at Unknown time  . lisinopril-hydrochlorothiazide (PRINZIDE,ZESTORETIC) 20-25 MG tablet Take 1 tablet by mouth daily.   03/25/2017 at Unknown time  . metoprolol tartrate (LOPRESSOR) 25 MG tablet Take 25 mg by mouth 2 (two) times daily.   03/25/2017 at Unknown time  . traZODone (DESYREL) 50 MG tablet Take 50 mg by mouth at bedtime as needed for sleep.   03/25/2017 at Unknown time  . pravastatin (PRAVACHOL) 20  MG tablet Take 20 mg by mouth daily.   Unknown at Unknown time    Patient Stressors: Financial difficulties Loss of girlfriend still in hometown Substance abuse  Patient Strengths: Ability for insight Active sense of humor Average or above average intelligence Capable of independent living Occupational psychologist fund of knowledge Motivation for treatment/growth Physical Health Religious Affiliation Special hobby/interest Supportive family/friends Work skills  Treatment Modalities: Medication Management, Group therapy, Case management,  1 to 1 session with clinician, Psychoeducation, Recreational therapy.   Physician Treatment Plan for Primary Diagnosis: Major Depressive Disorder, recurrent, severe without psychotic features  Medication Management: Evaluate patient's response, side effects, and tolerance of medication regimen.  Therapeutic Interventions: 1 to 1 sessions, Unit Group sessions and Medication administration.  Evaluation of Outcomes:  Not Met  Physician Treatment Plan for Secondary Diagnosis: Active Problems:   Major depressive disorder, recurrent severe without psychotic features ([place])  Medication Management: Evaluate patient's response, side effects, and tolerance of medication regimen.  Therapeutic Interventions: 1 to 1 sessions, Unit Group sessions and Medication administration.  Evaluation of Outcomes: Not Met   RN Treatment Plan for Primary Diagnosis: Major Depressive Disorder, recurrent, severe without psychotic features Long Term Goal(s): Knowledge of disease and therapeutic regimen to maintain health will improve  Short Term Goals: Ability to remain free from injury will improve, Ability to verbalize feelings will improve, Ability to disclose and discuss suicidal ideas and Ability to identify and develop effective coping behaviors will improve  Medication Management: RN will administer medications as ordered by provider, will assess and evaluate patient's response and provide education to patient for prescribed medication. RN will report any adverse and/or side effects to prescribing provider.  Therapeutic Interventions: 1 on 1 counseling sessions, Psychoeducation, Medication administration, Evaluate responses to treatment, Monitor vital signs and CBGs as ordered, Perform/monitor CIWA, COWS, AIMS and Fall Risk screenings as ordered, Perform wound care treatments as ordered.  Evaluation of Outcomes: Progressing   LCSW Treatment Plan for Primary Diagnosis: Major Depressive Disorder, recurrent, severe without psychotic features Long Term Goal(s): Safe transition to appropriate next level of care at discharge, Engage patient in therapeutic group addressing interpersonal concerns.  Short Term Goals: Engage patient in aftercare planning with referrals and resources, Facilitate patient progression through stages of change regarding substance use diagnoses and concerns and Identify triggers associated with mental  health/substance abuse issues  Therapeutic Interventions: Assess for all discharge needs, 1 to 1 time with Social worker, Explore available resources and support systems, Assess for adequacy in community support network, Educate family and significant other(s) on suicide prevention, Complete Psychosocial Assessment, Interpersonal group therapy.  Evaluation of Outcomes: Progressing   Progress in Treatment: Attending groups: No. New to unit. Continuing to assess.  Participating in groups: No. Taking medication as prescribed: Yes. Toleration medication: Yes. Family/Significant other contact made: Yes, pt's girlfriend contacted for SPE and for collateral information.  Patient understands diagnosis: Yes. Discussing patient identified problems/goals with staff: Yes. Medical problems stabilized or resolved: Yes. Denies suicidal/homicidal ideation: Yes. Issues/concerns per patient self-inventory: No. Other: n/a   New problem(s) identified: No, Describe:  n/a  New Short Term/Long Term Goal(s): elimination of SI thoughts; detox, medication management for mood stabilization, development of comprehensive mental wellness/sobriety plan.   Discharge Plan or Barriers: CSW assessing--Pt plans to follow-up at [facility] until insurance kicks in early next month. Pt given [facility] information if he wants to change services at that point. He declined [facility] and residential referrals at this time due to  his need to return to work (12 hour shifts). [place] pamphlet also provided for additional community supports.   Reason for Continuation of Hospitalization: Anxiety Depression Medication stabilization Suicidal ideation Withdrawal symptoms  Estimated Length of Stay: Tuesday, 03/30/17  Attendees: Patient: 03/01/2017 7:56 AM  Physician: Dr. Parke Poisson MD; Dr. Nancy Fetter MD 03/01/2017 7:56 AM  Nursing: Niel Hummer RN 03/01/2017 7:56 AM  RN Care Manager:x 03/01/2017 7:56 AM  Social Worker: Press photographer, LCSW  03/01/2017 7:56 AM  Recreational Therapist: x 03/01/2017 7:56 AM  Other: Lindell Spar NP; Darnelle Maffucci Money NP 03/01/2017 7:56 AM  Other:  03/01/2017 7:56 AM  Other: 03/01/2017 7:56 AM    Scribe for Treatment Team: [place], LCSW 03/01/2017 7:56 AM

## 2017-03-26 NOTE — H&P (Signed)
Psychiatric Admission Assessment Adult  Patient Identification: Benjamin Richard MRN:  [id number] Date of Evaluation:  04/07/202018 Chief Complaint:  MDD single episode severe  alcohol use disorder  Principal Diagnosis: Major depressive disorder, recurrent severe without psychotic features ([place]) Diagnosis:   Patient Active Problem List   Diagnosis Date Noted  . Alcohol dependence ([place]) [F10.20] 03/25/2017  . Major depressive disorder, recurrent severe without psychotic features ([place]) [F33.2] 03/25/2017   History of Present Illness:  03/25/17 [facility] Counselor Assessment: 56 y.o. male  Who came in by police after calling his primary care doctor this AM and stating that he was suicidal with a plan to kill himself with a gun that he has at his home. Pt states that he doesn't feel suicidal anymore but does feel he needs help. Pt BAL is currently 321 but is coherent with pleasant affect. Pt states that he has recently relpased on alcohol for the past 2 weeks and has a history of alcoholism for years. Pt wanted to leave during assessment because he is worried about his dog being at home alone. Pt had to be IVCd by Dr. Lacinda Axon because he said he could "just walk out the door". Pt meets criteria for IVC due to the following suicide risk factors- age, race, access to a gun, stating suicidal ideation, substance abuse and living environment (pt lives alone).  Pt states that his current stressor is moving from [place] where his family and friends are to [place] for a job. Pt states that he lives alone in [place] and has no supports. 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 Pt states that he has trouble falling asleep and self medicates with alcohol during the week. He states that he used to go to [place] but feels it makes him more depressed. Pt states that he would like to start going to outpatient counseling and build a support system.  Pt denies HI, AVH or any other  psychiatric issues. He has never been admitted inpatient in the past but has gotten substance abuse treatment. Pt states that he wanted to get help sooner but doesn't have insurance until January 2019.   03/26/17: Patient reports that he has moved recently from [place] to [place].and lost all of his contacts and became depressed. He had been sober form alcohol from April 2018 until October 2018 and relapsed. For the last 4 weeks has been drinking approximately 1.5 gallons of vodka weekly. Reports alcohol abuse for the last 15 years.  He doesn't feel as if he is having any current withdrawal symptoms. He denies any severe withdrawals in the past or DTs. Has a hx of one DUI. Denies any blackouts in the past.   Dog has been taken to a kennel and taken care of until he discharges.  Associated Signs/Symptoms: Depression Symptoms:  depressed mood, anhedonia, insomnia, suicidal thoughts with specific plan, (Hypo) Manic Symptoms:  Denies Anxiety Symptoms:  Denies Psychotic Symptoms:  Reports vidions when he closes his eyes since he started drinking again PTSD Symptoms: NA Total Time spent with patient: 45 minutes  Past Psychiatric History: Prior psych admission 3 years ago for detox.  Is the patient at risk to self? Yes.    Has the patient been a risk to self in the past 6 months? No.  Has the patient been a risk to self within the distant past? No.  Is the patient a risk to others? No.  Has the patient been a risk to  others in the past 6 months? No.  Has the patient been a risk to others within the distant past? No.   Prior Inpatient Therapy:   Prior Outpatient Therapy:    Alcohol Screening: 1. How often do you have a drink containing alcohol?: 4 or more times a week 2. How many drinks containing alcohol do you have on a typical day when you are drinking?: 10 or more 3. How often do you have six or more drinks on one occasion?: Daily or almost daily AUDIT-C Score: 12 4. How often  during the last year have you found that you were not able to stop drinking once you had started?: Daily or almost daily 5. How often during the last year have you failed to do what was normally expected from you becasue of drinking?: Less than monthly 6. How often during the last year have you needed a first drink in the morning to get yourself going after a heavy drinking session?: Monthly 7. How often during the last year have you had a feeling of guilt of remorse after drinking?: Less than monthly 8. How often during the last year have you been unable to remember what happened the night before because you had been drinking?: Less than monthly 9. Have you or someone else been injured as a result of your drinking?: No 10. Has a relative or friend or a doctor or another health worker been concerned about your drinking or suggested you cut down?: Yes, during the last year Alcohol Use Disorder Identification Test Final Score (AUDIT): 25 Intervention/Follow-up: Medication Offered/Prescribed Substance Abuse History in the last 12 months:  Yes.   Consequences of Substance Abuse: Medical Consequences:  reviewed Legal Consequences:  reviewed Family Consequences:  reviewed Previous Psychotropic Medications: Librium for detox Psychological Evaluations: Yes  Past Medical History:  Past Medical History:  Diagnosis Date  . GERD (gastroesophageal reflux disease)   . Hypertension     Past Surgical History:  Procedure Laterality Date  . open heart surgery  2010   Family History: History reviewed. No pertinent family history. Family Psychiatric  History: Father alcoholic died of pancreatic cancer  Tobacco Screening: Have you used any form of tobacco in the last 30 days? (Cigarettes, Smokeless Tobacco, Cigars, and/or Pipes): No Social History:  Social History   Substance and Sexual Activity  Alcohol Use Yes     Social History   Substance and Sexual Activity  Drug Use No    Additional Social  History: Marital status: Divorced Divorced, when?: 2011 What types of issues is patient dealing with in the relationship?: Current girlfriend for 2 years.  She is upset about the alcohol. Are you sexually active?: Yes What is your sexual orientation?: heterosexual Has your sexual activity been affected by drugs, alcohol, medication, or emotional stress?: yes Does patient have children?: No(2 step children from previous marriage)    Pain Medications: none Prescriptions: none Over the Counter: none History of alcohol / drug use?: Yes Longest period of sobriety (when/how long): 07/2016 -12/2016 Negative Consequences of Use: Legal, Personal relationships Withdrawal Symptoms: Nausea / Vomiting, Sweats Name of Substance 1: ALCOHOL  1 - Age of First Use: 56 yrs old 1 - Amount (size/oz): #3 half gallons weekly 1 - Frequency: DAILY  1 - Last Use / Amount: YESTERDAY                   Allergies:  No Known Allergies Lab Results:  Results for orders placed or performed during the  hospital encounter of 03/25/17 (from the past 48 hour(s))  CBC with Differential     Status: Abnormal   Collection Time: 03/25/17 11:48 AM  Result Value Ref Range   WBC 6.4 4.0 - 10.5 K/uL   RBC 4.90 4.22 - 5.81 MIL/uL   Hemoglobin 14.5 13.0 - 17.0 g/dL   HCT 42.3 39.0 - 52.0 %   MCV 86.3 78.0 - 100.0 fL   MCH 29.6 26.0 - 34.0 pg   MCHC 34.3 30.0 - 36.0 g/dL   RDW 16.5 (H) 11.5 - 15.5 %   Platelets 157 150 - 400 K/uL   Neutrophils Relative % 49 %   Neutro Abs 3.1 1.7 - 7.7 K/uL   Lymphocytes Relative 41 %   Lymphs Abs 2.6 0.7 - 4.0 K/uL   Monocytes Relative 9 %   Monocytes Absolute 0.6 0.1 - 1.0 K/uL   Eosinophils Relative 1 %   Eosinophils Absolute 0.1 0.0 - 0.7 K/uL   Basophils Relative 0 %   Basophils Absolute 0.0 0.0 - 0.1 K/uL  Comprehensive metabolic panel     Status: Abnormal   Collection Time: 03/25/17 11:48 AM  Result Value Ref Range   Sodium 138 135 - 145 mmol/L   Potassium 4.0 3.5 - 5.1  mmol/L   Chloride 97 (L) 101 - 111 mmol/L   CO2 28 22 - 32 mmol/L   Glucose, Bld 99 65 - 99 mg/dL   BUN 19 6 - 20 mg/dL   Creatinine, Ser 1.09 0.61 - 1.24 mg/dL   Calcium 9.1 8.9 - 10.3 mg/dL   Total Protein 8.3 (H) 6.5 - 8.1 g/dL   Albumin 4.1 3.5 - 5.0 g/dL   AST 73 (H) 15 - 41 U/L   ALT 48 17 - 63 U/L   Alkaline Phosphatase 72 38 - 126 U/L   Total Bilirubin 1.0 0.3 - 1.2 mg/dL   GFR calc non Af Amer >60 >60 mL/min   GFR calc Af Amer >60 >60 mL/min    Comment: (NOTE) The eGFR has been calculated using the CKD EPI equation. This calculation has not been validated in all clinical situations. eGFR's persistently <60 mL/min signify possible Chronic Kidney Disease.    Anion gap 13 5 - 15  Ethanol     Status: Abnormal   Collection Time: 03/25/17 11:48 AM  Result Value Ref Range   Alcohol, Ethyl (B) 321 (HH) <10 mg/dL    Comment:        LOWEST DETECTABLE LIMIT FOR SERUM ALCOHOL IS 10 mg/dL FOR MEDICAL PURPOSES ONLY CRITICAL RESULT CALLED TO, READ BACK BY AND VERIFIED WITH: M.ROSSER RN [id number] [phone] A.[email or web address]   Urinalysis, Routine w reflex microscopic     Status: Abnormal   Collection Time: 03/25/17  1:30 PM  Result Value Ref Range   Color, Urine YELLOW YELLOW   APPearance CLEAR CLEAR   Specific Gravity, Urine 1.013 1.005 - 1.030   pH 6.0 5.0 - 8.0   Glucose, UA NEGATIVE NEGATIVE mg/dL   Hgb urine dipstick SMALL (A) NEGATIVE   Bilirubin Urine NEGATIVE NEGATIVE   Ketones, ur NEGATIVE NEGATIVE mg/dL   Protein, ur 100 (A) NEGATIVE mg/dL   Nitrite NEGATIVE NEGATIVE   Leukocytes, UA NEGATIVE NEGATIVE   RBC / HPF 0-5 0 - 5 RBC/hpf   WBC, UA 0-5 0 - 5 WBC/hpf   Bacteria, UA NONE SEEN NONE SEEN   Squamous Epithelial / LPF 0-5 (A) NONE SEEN   Mucus PRESENT    Hyaline Casts, UA  PRESENT   Urine rapid drug screen (hosp performed)     Status: None   Collection Time: 03/25/17  1:30 PM  Result Value Ref Range   Opiates NONE DETECTED NONE DETECTED   Cocaine NONE DETECTED NONE DETECTED    Benzodiazepines NONE DETECTED NONE DETECTED   Amphetamines NONE DETECTED NONE DETECTED   Tetrahydrocannabinol NONE DETECTED NONE DETECTED   Barbiturates NONE DETECTED NONE DETECTED    Comment:        DRUG SCREEN FOR MEDICAL PURPOSES ONLY.  IF CONFIRMATION IS NEEDED FOR ANY PURPOSE, NOTIFY LAB WITHIN 5 DAYS.        LOWEST DETECTABLE LIMITS FOR URINE DRUG SCREEN Drug Class       Cutoff (ng/mL) Amphetamine      1000 Barbiturate      200 Benzodiazepine   546 Tricyclics       568 Opiates          300 Cocaine          300 THC              50     Blood Alcohol level:  Lab Results  Component Value Date   ETH 321 (HH) 12/75/1700    Metabolic Disorder Labs:  No results found for: HGBA1C, MPG No results found for: PROLACTIN No results found for: CHOL, TRIG, HDL, CHOLHDL, VLDL, LDLCALC  Current Medications: Current Facility-Administered Medications  Medication Dose Route Frequency Provider Last Rate Last Dose  . acetaminophen (TYLENOL) tablet 650 mg  650 mg Oral Q6H PRN Patrecia Pour, NP      . alum & mag hydroxide-simeth (MAALOX/MYLANTA) 200-200-20 MG/5ML suspension 30 mL  30 mL Oral Q4H PRN Patrecia Pour, NP      . aspirin EC tablet 81 mg  81 mg Oral Daily Patrecia Pour, NP   81 mg at 03/26/17 0810  . gabapentin (NEURONTIN) capsule 300 mg  300 mg Oral TID Patrecia Pour, NP   300 mg at 03/26/17 1159  . hydrochlorothiazide (MICROZIDE) capsule 12.5 mg  12.5 mg Oral BID Cobos, Myer Peer, MD   12.5 mg at 03/26/17 1018  . hydrOXYzine (ATARAX/VISTARIL) tablet 25 mg  25 mg Oral Q6H PRN Lindon Romp A, NP      . lisinopril (PRINIVIL,ZESTRIL) tablet 10 mg  10 mg Oral BID AC & HS Cobos, Myer Peer, MD   10 mg at 03/26/17 1018  . loperamide (IMODIUM) capsule 2-4 mg  2-4 mg Oral PRN Lindon Romp A, NP      . LORazepam (ATIVAN) tablet 1 mg  1 mg Oral Q6H PRN Lindon Romp A, NP   1 mg at 03/26/17 0158  . LORazepam (ATIVAN) tablet 1 mg  1 mg Oral QID Lindon Romp A, NP   1 mg at  03/26/17 1159   Followed by  . [START ON 03/27/2017] LORazepam (ATIVAN) tablet 1 mg  1 mg Oral TID Rozetta Nunnery, NP       Followed by  . [START ON 03/28/2017] LORazepam (ATIVAN) tablet 1 mg  1 mg Oral BID Rozetta Nunnery, NP       Followed by  . [START ON 03/29/2017] LORazepam (ATIVAN) tablet 1 mg  1 mg Oral Daily Lindon Romp A, NP      . magnesium hydroxide (MILK OF MAGNESIA) suspension 30 mL  30 mL Oral Daily PRN Patrecia Pour, NP      . metoprolol tartrate (LOPRESSOR) tablet 25 mg  25 mg Oral  BID Patrecia Pour, NP   25 mg at 03/26/17 8177  . ondansetron (ZOFRAN-ODT) disintegrating tablet 4 mg  4 mg Oral Q6H PRN Lindon Romp A, NP   4 mg at 03/26/17 0802  . pravastatin (PRAVACHOL) tablet 20 mg  20 mg Oral Daily Patrecia Pour, NP   20 mg at 03/26/17 0809  . thiamine (VITAMIN B-1) tablet 100 mg  100 mg Oral Daily Patrecia Pour, NP   100 mg at 03/26/17 1165   Or  . thiamine (B-1) injection 100 mg  100 mg Intravenous Daily Patrecia Pour, NP      . traZODone (DESYREL) tablet 50 mg  50 mg Oral QHS PRN Patrecia Pour, NP   50 mg at 03/26/17 0157   PTA Medications: Medications Prior to Admission  Medication Sig Dispense Refill Last Dose  . aspirin EC 81 MG tablet Take 81 mg by mouth daily.   03/25/2017 at Unknown time  . gabapentin (NEURONTIN) 300 MG capsule Take 300 mg by mouth 3 (three) times daily.   03/25/2017 at Unknown time  . lisinopril-hydrochlorothiazide (PRINZIDE,ZESTORETIC) 20-25 MG tablet Take 1 tablet by mouth daily.   03/25/2017 at Unknown time  . metoprolol tartrate (LOPRESSOR) 25 MG tablet Take 25 mg by mouth 2 (two) times daily.   03/25/2017 at Unknown time  . traZODone (DESYREL) 50 MG tablet Take 50 mg by mouth at bedtime as needed for sleep.   03/25/2017 at Unknown time  . pravastatin (PRAVACHOL) 20 MG tablet Take 20 mg by mouth daily.   Unknown at Unknown time    Musculoskeletal: Strength & Muscle Tone: within normal limits Gait & Station: normal Patient  leans: N/A  Psychiatric Specialty Exam: Physical Exam  Nursing note and vitals reviewed. Constitutional: He is oriented to person, place, and time. He appears well-developed and well-nourished.  Respiratory: Effort normal.  Musculoskeletal: Normal range of motion.  Neurological: He is alert and oriented to person, place, and time.  Skin: Skin is warm.    Review of Systems  Constitutional: Negative.   HENT: Negative.   Eyes: Negative.   Respiratory: Negative.   Cardiovascular: Negative.   Gastrointestinal: Negative.   Genitourinary: Negative.   Musculoskeletal: Negative.   Skin: Negative.   Neurological: Negative.   Endo/Heme/Allergies: Negative.     Blood pressure (!) 150/102, pulse (!) 105, temperature 98 F (36.7 C), resp. rate 16, height _0  (1.956 m), weight 122 kg (269 lb).Body mass index is 31.9 kg/m.  General Appearance: Casual  Eye Contact:  Good  Speech:  Clear and Coherent and Normal Rate  Volume:  Normal  Mood:  Euthymic  Affect:  Congruent  Thought Process:  Goal Directed and Descriptions of Associations: Intact  Orientation:  Full (Time, Place, and Person)  Thought Content:  WDL  Suicidal Thoughts:  No none currently  Homicidal Thoughts:  No  Memory:  Immediate;   Good Recent;   Good Remote;   Good  Judgement:  Good  Insight:  Good  Psychomotor Activity:  Normal  Concentration:  Concentration: Good and Attention Span: Good  Recall:  Good  Fund of Knowledge:  Good  Language:  Good  Akathisia:  No  Handed:  Right  AIMS (if indicated):     Assets:  Communication Skills Desire for Improvement Financial Resources/Insurance Housing Social Support Transportation  ADL's:  Intact  Cognition:  WNL  Sleep:  Number of Hours: 3.25    Treatment Plan Summary: Daily contact with patient to assess  and evaluate symptoms and progress in treatment, Medication management and Plan is to:  -Encourage group therapy participation -See SRA and MAR for medication  management  Observation Level/Precautions:  15 minute checks  Laboratory:  Reviewed  Psychotherapy:  Group therapy  Medications: See [facility]  Consultations:  As needed  Discharge Concerns:  Relapse  Estimated LOS: 3-5 days  Other:  Admit to [place] for Primary Diagnosis: Major depressive disorder, recurrent severe without psychotic features ([place]) Long Term Goal(s): Improvement in symptoms so as ready for discharge  Short Term Goals: Ability to verbalize feelings will improve, Ability to disclose and discuss suicidal ideas and Ability to identify triggers associated with substance abuse/mental health issues will improve  Physician Treatment Plan for Secondary Diagnosis: Principal Problem:   Major depressive disorder, recurrent severe without psychotic features ([place]) Active Problems:   Alcohol dependence ([place])  Long Term Goal(s): Improvement in symptoms so as ready for discharge  Short Term Goals: Ability to demonstrate self-control will improve, Ability to identify and develop effective coping behaviors will improve and Compliance with prescribed medications will improve  I certify that inpatient services furnished can reasonably be expected to improve the patient's condition.    Lewis Shock, FNP [id number]:52 PM   I have discussed case with NP and have met with patient  Agree with NP note and assessment  48 old male, divorced, lives alone, employed , has two adult step children. Recently relocated due to employment about 2-3 months ago, which has been challenging in that his GF is no longer geographically close to him.  Reports he called a suicide hotline due to worsening depression and suicidal ideations , with thoughts of shooting self . The police were called and was brought to ED yesterday. Describes some neuro-vegetative symptoms- decreased appetite, some anhedonia. Denies any hallucinations or psychotic symptoms. Patient reports heavy , daily  alcohol consumption- admission BAL 321, UDS negative .  He reports a history of alcohol use disorder, had relapsed about two months ago after a period of several months of sobriety. States he drinks close to two gallons of liquor per week. Denies any drug abuse . Denies history of DTs or WDL seizures. No history of blackouts, DUI x 1 in 2015.   One prior psychiatric admission for alcohol dependence and detox . He has never attempted suicide. Denies history of mania, denies history of psychosis.  Medical History of HTN, states he was told he had DM in the past, but serial HgbA1Cs have been WNL. History of CAD, S/P bypass surgery   Father deceased from pancreatic cancer, he had history of alcohol dependence, mother alive . No history of mental illness or of su  Dx- Alcohol Dependence, Alcohol Induced Mood Disorder, Depressed   Plan- Inpatient admission. Start Ativan detox protocol to address alcohol withdrawal ( reports he has been on Librium and on Ativan for detox in the past, prefers the latter ) . We discussed starting antidepressant medication - prefers to monitor mood in the context of sobriety and consider antidepressant if mood does not continue to improve with sobriety.

## 2017-03-26 NOTE — Progress Notes (Signed)
D: Patient observed up and visible in the milieu. Patient states his main issue this morning is nausea. States he was only able to take a few bites of food. Says he normally has trouble swallowing pills even without the nausea. Patient's affect anxious, depressed and sad with congruent mood. Per self inventory and discussions with writer, rates depression at a 4/10, hopelessness at a 3/10 and anxiety at a 2/10. Rates sleep as poor, appetite as poor, energy as low and concentration as good.  States goal for today is to "finish withdrawals, start counseling, attend meetings." Denies pain. BP elevated this AM and patient asking about antihypertensives ordered as he states he should be getting lisinopril and hctz.  A: Medicated per orders, prn zofran and ginger ale given for nausea. Money, NP, Cobos, MD made aware of BP and chart reviewed. Orders received to restart lisnopril/hctz home dose. Level III obs in place for safety. Emotional support offered and self inventory reviewed. Encouraged completion of Suicide Safety Plan and programming participation. Discussed POC with MD, SW.    R: Patient verbalizes understanding of POC. On reassess, patient is able to tolerate his AM meds. Patient denies SI/HI/AVH and remains safe on level III obs. Will continue to monitor closely and make verbal contact frequently.

## 2017-03-26 NOTE — [facility] Suicide Risk Assessment (Signed)
[facility] INPATIENT:  Family/Significant Other Suicide Prevention Education  Suicide Prevention Education:  Education Completed; Benjamin Richard, girlfriend, [phone], [phone], has been identified by the patient as the family member/significant other with whom the patient will be residing, and identified as the person(s) who will aid the patient in the event of a mental health crisis (suicidal ideations/suicide attempt).  With written consent from the patient, the family member/significant other has been provided the following suicide prevention education, prior to the and/or following the discharge of the patient.  The suicide prevention education provided includes the following:  Suicide risk factors  Suicide prevention and interventions  National Suicide Hotline telephone number  [facility] assessment telephone number  [facility] Emergency Assistance 911  [facility] and/or Residential Mobile Crisis Unit telephone number  Request made of family/significant other to:  Remove weapons (e.g., guns, rifles, knives), all items previously/currently identified as safety concern.  Mickee agrees to remove pt guns when she comes to [place] to visit this weekend.  She did this previously when pt first stopped drinking and she took guns to pt's step son who kept them.  Remove drugs/medications (over-the-counter, prescriptions, illicit drugs), all items previously/currently identified as a safety concern. Mickee to look into this as well.  The family member/significant other verbalizes understanding of the suicide prevention education information provided.  The family member/significant other agrees to remove the items of safety concern listed above.  Mickee reports that alcohol continues to be the big issue.  Pt "is a very different person when drinking.  Mean."  She has told him that she cannot continue with him if he is drinking and has asked him to get into a support group.   Mickee also reports that pt has more time than he admits to as far as getting involved in church in [place] or other supports.  Pt has been choosing to not do so but could.  She has some concerns about a recent med switch from [place] to trazodone and reports pt mood changed for the worse with this change.  Lorri FrederickWierda, Meril Dray Jon, LCSW Jun 09, 202018, 11:00 AM

## 2017-03-26 NOTE — Tx Team (Signed)
Initial Treatment Plan 01/07/202018 1:12 AM Benjamin Richard ZOX:[id number]    PATIENT STRESSORS: Financial difficulties Loss of girlfriend still in hometown Substance abuse   PATIENT STRENGTHS: Ability for insight Active sense of humor Average or above average intelligence Capable of independent living Communication skills Financial means General fund of knowledge Motivation for treatment/growth Physical Health Religious Affiliation Special hobby/interest Supportive family/friends Work skills   PATIENT IDENTIFIED PROBLEMS: "to get a better support system"  "I want to permanently stop alcohol"      Depression  Alcohol dependence  Increased risk for suicide         DISCHARGE CRITERIA:  Ability to meet basic life and health needs Adequate post-discharge living arrangements Improved stabilization in mood, thinking, and/or behavior Medical problems require only outpatient monitoring Motivation to continue treatment in a less acute level of care Need for constant or close observation no longer present Reduction of life-threatening or endangering symptoms to within safe limits Safe-care adequate arrangements made Verbal commitment to aftercare and medication compliance Withdrawal symptoms are absent or subacute and managed without 24-hour nursing intervention  PRELIMINARY DISCHARGE PLAN: Attend 12-step recovery group Participate in family therapy Return to previous living arrangement Return to previous work or school arrangements  PATIENT/FAMILY INVOLVEMENT: This treatment plan has been presented to and reviewed with the patient, Benjamin Richard, and/or family member.  The patient and family have been given the opportunity to ask questions and make suggestions.  Doristine JohnsBrooks, Maika Kaczmarek Laverne, RN 01/07/202018, 1:12 AM

## 2017-03-26 NOTE — Progress Notes (Signed)
Patient's VS elevated prior to lunch. MHT obtained however did not notify RN. Upon patient's return from cafeteria, manual BP taken. Patient asymptomatic. States he can tell when BP is high - "I feel my heart beating" - however denies that sensation today. Money, NP made aware. No orders received at present. Will continue to monitor closely.
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
## 2017-03-26 NOTE — Progress Notes (Signed)
Recreation Therapy Notes  Date: 03/26/17  Time: 0930 Location: 300 Hall Dayroom  Group Topic: Stress Management  Goal Area(s) Addresses:  Patient will verbalize importance of using healthy stress management.  Patient will identify positive emotions associated with healthy stress management.   Behavioral Response: Engaged  Intervention: Stress Management  Activity :  Guided Imagery.  LRT introduced the stress management technique of guided imagery.  LRT read a script that allowed patients to envision lying in the sun and watching the clouds go by.  Patients were to listen and follow along as script was read.  Education:  Stress Management, Discharge Planning.   Education Outcome: Acknowledges edcuation/In group clarification offered/Needs additional education  Clinical Observations/Feedback: Pt attended group.    Caroll RancherMarjette Danice Dippolito, LRT/CTRS    Caroll RancherLindsay, Ferne Ellingwood A 2020-06-1416 11:34 AM
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
## 2017-03-27 DIAGNOSIS — F39 Unspecified mood [affective] disorder: Secondary | ICD-10-CM

## 2017-03-27 DIAGNOSIS — F419 Anxiety disorder, unspecified: Secondary | ICD-10-CM

## 2017-03-27 DIAGNOSIS — R450 Nervousness: Secondary | ICD-10-CM

## 2017-03-27 DIAGNOSIS — F1020 Alcohol dependence, uncomplicated: Secondary | ICD-10-CM

## 2017-03-27 MED ORDER — GABAPENTIN 300 MG PO CAPS
300.0000 mg | ORAL_CAPSULE | Freq: Three times a day (TID) | ORAL | Status: DC
  Administered 2017-03-27 – 2017-03-28 (×4): 300 mg via ORAL
  Filled 2017-03-27 (×2): qty 1
  Filled 2017-03-27 (×2): qty 21
  Filled 2017-03-27 (×3): qty 1
  Filled 2017-03-27: qty 21
  Filled 2017-03-27: qty 1

## 2017-03-27 MED ORDER — SERTRALINE HCL 50 MG PO TABS
50.0000 mg | ORAL_TABLET | Freq: Every day | ORAL | Status: DC
  Administered 2017-03-27 – 2017-03-28 (×2): 50 mg via ORAL
  Filled 2017-03-27 (×3): qty 1
  Filled 2017-03-27: qty 7

## 2017-03-27 MED ORDER — GABAPENTIN 300 MG PO CAPS
300.0000 mg | ORAL_CAPSULE | Freq: Once | ORAL | Status: AC
  Administered 2017-03-27: 300 mg via ORAL
  Filled 2017-03-27 (×2): qty 1

## 2017-03-27 NOTE — Progress Notes (Signed)
[facility] MD Progress Note  03/27/2017 10:09 AM Benjamin Richard  MRN:  [id number]   Subjective:  Patient reports that he is feeling good today and denies any withdrawal symptoms. He feels his mood is ok, but has decided to start an antidepressant. He reports some anxiety today as he is concerned about his work and no income at this time. He would like to be discharged no later than Monday. He also requests his Gabapentin be increased to 300 mg TID as he was taking at home for his neuropathy.  Objective: Patient's chart and findings reviewed and discussed with treatment team. Patient presents pleasant and cooperative. He has an appropriate affect. He has been seen in the day room. Will start Zoloft 50 mg Daily and increase the Gabapentin to 300 mg TID.   Principal Problem: Major depressive disorder, recurrent severe without psychotic features ([place]) Diagnosis:   Patient Active Problem List   Diagnosis Date Noted  . Alcohol dependence ([place]) [F10.20] 03/25/2017  . Major depressive disorder, recurrent severe without psychotic features ([place]) [F33.2] 03/25/2017   Total Time spent with patient: 25 minutes  Past Psychiatric History: See H&P  Past Medical History:  Past Medical History:  Diagnosis Date  . GERD (gastroesophageal reflux disease)   . Hypertension     Past Surgical History:  Procedure Laterality Date  . open heart surgery  2010   Family History: History reviewed. No pertinent family history. Family Psychiatric  History: See H&P Social History:  Social History   Substance and Sexual Activity  Alcohol Use Yes     Social History   Substance and Sexual Activity  Drug Use No    Social History   Socioeconomic History  . Marital status: Single    Spouse name: None  . Number of children: None  . Years of education: None  . Highest education level: None  Social Needs  . Financial resource strain: None  . Food insecurity - worry: None  . Food insecurity - inability: None  .  Transportation needs - medical: None  . Transportation needs - non-medical: None  Occupational History  . None  Tobacco Use  . Smoking status: Never Smoker  . Smokeless tobacco: Never Used  Substance and Sexual Activity  . Alcohol use: Yes  . Drug use: No  . Sexual activity: Yes    Birth control/protection: None  Other Topics Concern  . None  Social History Narrative  . None   Additional Social History:    Pain Medications: none Prescriptions: none Over the Counter: none History of alcohol / drug use?: Yes Longest period of sobriety (when/how long): 07/2016 -12/2016 Negative Consequences of Use: Legal, Personal relationships Withdrawal Symptoms: Nausea / Vomiting, Sweats Name of Substance 1: ALCOHOL  1 - Age of First Use: 56 yrs old 1 - Amount (size/oz): #3 half gallons weekly 1 - Frequency: DAILY  1 - Last Use / Amount: YESTERDAY                   Sleep: Good  Appetite:  Good  Current Medications: Current Facility-Administered Medications  Medication Dose Route Frequency Provider Last Rate Last Dose  . acetaminophen (TYLENOL) tablet 650 mg  650 mg Oral Q6H PRN Patrecia Pour, NP      . alum & mag hydroxide-simeth (MAALOX/MYLANTA) 200-200-20 MG/5ML suspension 30 mL  30 mL Oral Q4H PRN Patrecia Pour, NP      . aspirin EC tablet 81 mg  81 mg Oral Daily Waylan Boga  Y, NP   81 mg at 03/27/17 0829  . gabapentin (NEURONTIN) capsule 300 mg  300 mg Oral TID Money, Lowry Ram, FNP      . hydrochlorothiazide (MICROZIDE) capsule 12.5 mg  12.5 mg Oral BID Aemilia Dedrick A, MD   12.5 mg at 03/27/17 0831  . hydrOXYzine (ATARAX/VISTARIL) tablet 25 mg  25 mg Oral Q6H PRN Lindon Romp A, NP      . lisinopril (PRINIVIL,ZESTRIL) tablet 10 mg  10 mg Oral BID AC & HS Khyra Viscuso, Myer Peer, MD   10 mg at 03/27/17 0829  . loperamide (IMODIUM) capsule 2-4 mg  2-4 mg Oral PRN Lindon Romp A, NP      . LORazepam (ATIVAN) tablet 1 mg  1 mg Oral Q6H PRN Lindon Romp A, NP   1 mg at 03/26/17  0158  . LORazepam (ATIVAN) tablet 1 mg  1 mg Oral TID Lindon Romp A, NP   1 mg at 03/27/17 2585   Followed by  . [START ON 03/28/2017] LORazepam (ATIVAN) tablet 1 mg  1 mg Oral BID Rozetta Nunnery, NP       Followed by  . [START ON 03/29/2017] LORazepam (ATIVAN) tablet 1 mg  1 mg Oral Daily Lindon Romp A, NP      . magnesium hydroxide (MILK OF MAGNESIA) suspension 30 mL  30 mL Oral Daily PRN Patrecia Pour, NP      . metoprolol tartrate (LOPRESSOR) tablet 25 mg  25 mg Oral BID Patrecia Pour, NP   25 mg at 03/27/17 2778  . ondansetron (ZOFRAN-ODT) disintegrating tablet 4 mg  4 mg Oral Q6H PRN Lindon Romp A, NP   4 mg at 03/26/17 0802  . pravastatin (PRAVACHOL) tablet 20 mg  20 mg Oral Daily Patrecia Pour, NP   20 mg at 03/27/17 0830  . sertraline (ZOLOFT) tablet 50 mg  50 mg Oral Daily Money, Darnelle Maffucci B, FNP      . thiamine (VITAMIN B-1) tablet 100 mg  100 mg Oral Daily Patrecia Pour, NP   100 mg at 03/27/17 0830   Or  . thiamine (B-1) injection 100 mg  100 mg Intravenous Daily Patrecia Pour, NP      . traZODone (DESYREL) tablet 50 mg  50 mg Oral QHS PRN Patrecia Pour, NP   50 mg at 03/26/17 2121    Lab Results:  Results for orders placed or performed during the hospital encounter of 03/25/17 (from the past 48 hour(s))  CBC with Differential     Status: Abnormal   Collection Time: 03/25/17 11:48 AM  Result Value Ref Range   WBC 6.4 4.0 - 10.5 K/uL   RBC 4.90 4.22 - 5.81 MIL/uL   Hemoglobin 14.5 13.0 - 17.0 g/dL   HCT 42.3 39.0 - 52.0 %   MCV 86.3 78.0 - 100.0 fL   MCH 29.6 26.0 - 34.0 pg   MCHC 34.3 30.0 - 36.0 g/dL   RDW 16.5 (H) 11.5 - 15.5 %   Platelets 157 150 - 400 K/uL   Neutrophils Relative % 49 %   Neutro Abs 3.1 1.7 - 7.7 K/uL   Lymphocytes Relative 41 %   Lymphs Abs 2.6 0.7 - 4.0 K/uL   Monocytes Relative 9 %   Monocytes Absolute 0.6 0.1 - 1.0 K/uL   Eosinophils Relative 1 %   Eosinophils Absolute 0.1 0.0 - 0.7 K/uL   Basophils Relative 0 %   Basophils  Absolute 0.0 0.0 -  0.1 K/uL  Comprehensive metabolic panel     Status: Abnormal   Collection Time: 03/25/17 11:48 AM  Result Value Ref Range   Sodium 138 135 - 145 mmol/L   Potassium 4.0 3.5 - 5.1 mmol/L   Chloride 97 (L) 101 - 111 mmol/L   CO2 28 22 - 32 mmol/L   Glucose, Bld 99 65 - 99 mg/dL   BUN 19 6 - 20 mg/dL   Creatinine, Ser 1.09 0.61 - 1.24 mg/dL   Calcium 9.1 8.9 - 10.3 mg/dL   Total Protein 8.3 (H) 6.5 - 8.1 g/dL   Albumin 4.1 3.5 - 5.0 g/dL   AST 73 (H) 15 - 41 U/L   ALT 48 17 - 63 U/L   Alkaline Phosphatase 72 38 - 126 U/L   Total Bilirubin 1.0 0.3 - 1.2 mg/dL   GFR calc non Af Amer >60 >60 mL/min   GFR calc Af Amer >60 >60 mL/min    Comment: (NOTE) The eGFR has been calculated using the CKD EPI equation. This calculation has not been validated in all clinical situations. eGFR's persistently <60 mL/min signify possible Chronic Kidney Disease.    Anion gap 13 5 - 15  Ethanol     Status: Abnormal   Collection Time: 03/25/17 11:48 AM  Result Value Ref Range   Alcohol, Ethyl (B) 321 (HH) <10 mg/dL    Comment:        LOWEST DETECTABLE LIMIT FOR SERUM ALCOHOL IS 10 mg/dL FOR MEDICAL PURPOSES ONLY CRITICAL RESULT CALLED TO, READ BACK BY AND VERIFIED WITH: M.ROSSER RN [phone] A.QUIZON   Urinalysis, Routine w reflex microscopic     Status: Abnormal   Collection Time: 03/25/17  1:30 PM  Result Value Ref Range   Color, Urine YELLOW YELLOW   APPearance CLEAR CLEAR   Specific Gravity, Urine 1.013 1.005 - 1.030   pH 6.0 5.0 - 8.0   Glucose, UA NEGATIVE NEGATIVE mg/dL   Hgb urine dipstick SMALL (A) NEGATIVE   Bilirubin Urine NEGATIVE NEGATIVE   Ketones, ur NEGATIVE NEGATIVE mg/dL   Protein, ur 100 (A) NEGATIVE mg/dL   Nitrite NEGATIVE NEGATIVE   Leukocytes, UA NEGATIVE NEGATIVE   RBC / HPF 0-5 0 - 5 RBC/hpf   WBC, UA 0-5 0 - 5 WBC/hpf   Bacteria, UA NONE SEEN NONE SEEN   Squamous Epithelial / LPF 0-5 (A) NONE SEEN   Mucus PRESENT    Hyaline Casts, UA PRESENT    Urine rapid drug screen (hosp performed)     Status: None   Collection Time: 03/25/17  1:30 PM  Result Value Ref Range   Opiates NONE DETECTED NONE DETECTED   Cocaine NONE DETECTED NONE DETECTED   Benzodiazepines NONE DETECTED NONE DETECTED   Amphetamines NONE DETECTED NONE DETECTED   Tetrahydrocannabinol NONE DETECTED NONE DETECTED   Barbiturates NONE DETECTED NONE DETECTED    Comment:        DRUG SCREEN FOR MEDICAL PURPOSES ONLY.  IF CONFIRMATION IS NEEDED FOR ANY PURPOSE, NOTIFY LAB WITHIN 5 DAYS.        LOWEST DETECTABLE LIMITS FOR URINE DRUG SCREEN Drug Class       Cutoff (ng/mL) Amphetamine      1000 Barbiturate      200 Benzodiazepine   282 Tricyclics       060 Opiates          300 Cocaine          300 THC  50     Blood Alcohol level:  Lab Results  Component Value Date   ETH 321 (HH) 96/28/3662    Metabolic Disorder Labs: No results found for: HGBA1C, MPG No results found for: PROLACTIN No results found for: CHOL, TRIG, HDL, CHOLHDL, VLDL, LDLCALC  Physical Findings: AIMS: Facial and Oral Movements Muscles of Facial Expression: None, normal Lips and Perioral Area: None, normal Jaw: None, normal Tongue: None, normal,Extremity Movements Upper (arms, wrists, hands, fingers): None, normal Lower (legs, knees, ankles, toes): None, normal, Trunk Movements Neck, shoulders, hips: None, normal, Overall Severity Severity of abnormal movements (highest score from questions above): None, normal Incapacitation due to abnormal movements: None, normal Patient's awareness of abnormal movements (rate only patient's report): No Awareness, Dental Status Current problems with teeth and/or dentures?: No Does patient usually wear dentures?: No  CIWA:  CIWA-Ar Total: 1 COWS:     Musculoskeletal: Strength & Muscle Tone: within normal limits Gait & Station: normal Patient leans: N/A  Psychiatric Specialty Exam: Physical Exam  Nursing note and vitals  reviewed. Constitutional: He is oriented to person, place, and time. He appears well-developed and well-nourished.  Cardiovascular: Normal rate.  Respiratory: Effort normal.  Musculoskeletal: Normal range of motion.  Neurological: He is alert and oriented to person, place, and time.  Skin: Skin is warm.    Review of Systems  Constitutional: Negative.   HENT: Negative.   Eyes: Negative.   Respiratory: Negative.   Cardiovascular: Negative.   Gastrointestinal: Negative.   Genitourinary: Negative.   Musculoskeletal: Negative.   Skin: Negative.   Neurological: Negative.   Endo/Heme/Allergies: Negative.   Psychiatric/Behavioral: Negative for hallucinations and suicidal ideas. The patient is nervous/anxious.     Blood pressure (!) 144/89, pulse 98, temperature 98.4 F (36.9 C), temperature source Oral, resp. rate 16, height _0  (1.956 m), weight 122 kg (269 lb).Body mass index is 31.9 kg/m.  General Appearance: Casual  Eye Contact:  Good  Speech:  Clear and Coherent and Normal Rate  Volume:  Normal  Mood:  Euthymic  Affect:  Flat  Thought Process:  Goal Directed and Descriptions of Associations: Intact  Orientation:  Full (Time, Place, and Person)  Thought Content:  WDL  Suicidal Thoughts:  No  Homicidal Thoughts:  No  Memory:  Immediate;   Good Recent;   Good Remote;   Good  Judgement:  Good  Insight:  Good  Psychomotor Activity:  Normal  Concentration:  Concentration: Good and Attention Span: Good  Recall:  Good  Fund of Knowledge:  Good  Language:  Good  Akathisia:  No  Handed:  Right  AIMS (if indicated):     Assets:  Communication Skills Desire for Improvement Financial Resources/Insurance Housing Physical Health Social Support Transportation  ADL's:  Intact  Cognition:  WNL  Sleep:  Number of Hours: 6.75   Problems Addressed: Alcohol dependence MDD Severe  Treatment Plan Summary: Daily contact with patient to assess and evaluate symptoms and progress  in treatment, Medication management and Plan is to:  -Start Zoloft 50 mg PO Daily for mood stability -Continue Ativan Detox Protocol -Increase Gabapentin 300 mg TID for withdrawal symptoms and neuropathy -Encourage group therapy participation   Lewis Shock, FNP 03/27/2017, 10:09 AM   Agree with NP Progress Note

## 2017-03-27 NOTE — BHH Group Notes (Signed)
[facility]  Date/Time:    03/27/2017 1:15-2:15pm  Type of Therapy and Topic:  Group Therapy:  Healthy vs Unhealthy Coping Skills  Participation Level:  Active   Description of Group:  The focus of this group was to determine what unhealthy coping techniques typically are used by group members and what healthy coping techniques would be helpful in coping with various problems. Patients were guided in becoming aware of the differences between healthy and unhealthy coping techniques.  Patients were asked to identify 1-2 healthy coping skills they would like to learn to use more effectively, and many mentioned meditation, breathing, and relaxation.  These were explained, samples demonstrated, and resources shared for how to learn more at discharge.   At the end of group, additional ideas of healthy coping skills were shared in a fun exercise.  Therapeutic Goals 1. Patients learned that coping is what human beings do all day long to deal with various situations in their lives 2. Patients defined and discussed healthy vs unhealthy coping techniques 3. Patients identified their preferred coping techniques and identified whether these were healthy or unhealthy 4. Patients determined 1-2 healthy coping skills they would like to become more familiar with and use more often, and practiced a few meditations 5. Patients provided support and ideas to each other  Summary of Patient Progress: During group, patient expressed that he is in the hospital because of alcohol abuse and suicidal ideation.  He said alcohol has taken many precious things from him, but has not yet taken his life.  He was tearful and very supportive of others.  He is very willing to add a variety of healthy coping techniques, including turning off the TV and turning on music, taking his dog for walks, and meditation.   Therapeutic Modalities Cognitive Behavioral Therapy Motivational Interviewing   Benjamin MantleMareida  Grossman-Orr, LCSW 03/27/2017, 4:04 PM

## 2017-03-27 NOTE — BHH Group Notes (Signed)
Pt did not attend AA group. Pt stayed in their room.

## 2017-03-27 NOTE — Progress Notes (Signed)
D.  Pt pleasant on approach, denies complaints at this time.  Pt hopeful for discharge on Monday.  Pt did not attend evening AA group, but was observed interacting appropriately with peers on the unit.  Pt states he takes his last dose of Neurontin at bedtime because it helps with his restless leg.  Pt denies SI/HI/AVH at this time.  A.  Support and encouragement offered, notified pharmacy of Pt request  R.  Pt remains safe on the unit, will continue to monitor.

## 2017-03-27 NOTE — Progress Notes (Signed)
D. Pt observed sitting in dayroom, interacting with peers. Pt pleasant and cooperative during interactions. Pt reports having slept well last night. Patient denies pain at this time. Per pt's self inventory, pt rates his depression, hopelessness and anxiety a 1/0/2,respectively. Pt currently denies SI/HI and AVH.  A. Labs and vitals monitored. Pt compliant with medications. Pt supported emotionally and encouraged to express concerns and ask questions.   R. Pt remains safe with 15 minute checks. Will continue POC.

## 2017-03-28 MED ORDER — HYDROXYZINE HCL 25 MG PO TABS: 25.0000 mg | ORAL_TABLET | Freq: Four times a day (QID) | ORAL | 0 refills | Status: AC | PRN

## 2017-03-28 MED ORDER — SERTRALINE HCL 50 MG PO TABS: 50.0000 mg | ORAL_TABLET | Freq: Every day | ORAL | 0 refills | Status: AC

## 2017-03-28 NOTE — Discharge Summary (Signed)
Physician Discharge Summary Note  Patient:  Benjamin Richard is an 56 y.o., male MRN:  [id number] DOB:  [birth year] Patient phone:  [phone] (home)  Patient address:   [address] [place] [address],  Total Time spent with patient: 20 minutes  Date of Admission:  03/25/2017 Date of Discharge: 03/28/17   Reason for Admission:  Worsening depression with SI and alcohol abuse  Principal Problem: Major depressive disorder, recurrent severe without psychotic features [facility](HCC) Discharge Diagnoses: Patient Active Problem List   Diagnosis Date Noted  . Alcohol dependence (HCC) [F10.20] 03/25/2017  . Major depressive disorder, recurrent severe without psychotic features (HCC) [F33.2] 03/25/2017    Past Psychiatric History: Prior psych admission 3 years ago for detox  Past Medical History:  Past Medical History:  Diagnosis Date  . GERD (gastroesophageal reflux disease)   . Hypertension     Past Surgical History:  Procedure Laterality Date  . open heart surgery  2010   Family History: History reviewed. No pertinent family history. Family Psychiatric  History: Father alcoholic died of pancreatic cancer  Social History:  Social History   Substance and Sexual Activity  Alcohol Use Yes     Social History   Substance and Sexual Activity  Drug Use No    Social History   Socioeconomic History  . Marital status: Single    Spouse name: None  . Number of children: None  . Years of education: None  . Highest education level: None  Social Needs  . Financial resource strain: None  . Food insecurity - worry: None  . Food insecurity - inability: None  . Transportation needs - medical: None  . Transportation needs - non-medical: None  Occupational History  . None  Tobacco Use  . Smoking status: Never Smoker  . Smokeless tobacco: Never Used  Substance and Sexual Activity  . Alcohol use: Yes  . Drug use: No  . Sexual activity: Yes    Birth control/protection:  None  Other Topics Concern  . None  Social History Narrative  . None    Hospital Course:   03[facility] Counsel[facility] Assessment: 56 y.o.maleWho came in by police aftercalling his primary care doctor this AM and stating that he was suicidal with a plan to kill himself with a gun that he has at his home. Pt states that he doesn't feel suicidal anymore but does feel he needs help. Pt BAL is currently 321 but is coherent with pleasant affect. Pt states that he has recently relpased on alcohol for the past 2 weeksand has a history of alcoholism for years. Ptwanted to leave during assessment because he is worried about his dog being at home alone. Pt had to be IVCd by Dr. Adriana Simasook because he said he could "just walk out the door". Ptmeets criteria for IVC due to the following suicide risk factors- age, race, access to a gun, stating suicidal ideation, substance abuse and living environment (pt lives alone).  Pt states that his current stressor [place] from [place]. Pt states that he lives alone in [place] and has no supports. 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 Pt states that he has trouble falling asleepand self medicates with alcohol during the week. He states that he used to go to AA but feels it makes him more depressed. Pt states that he would like to start going to outpatient  counseling and build a support system.  Pt denies HI, AVH or any other psychiatric issues. He has never been admitted inpatient in the past but has gotten substance abuse treatment. Pt states that he wanted to get help sooner but doesn't have insurance until January 2019.  03/26/17: Patient reports that he has moved recently from [place]and lost all of his contacts and became depressed. He had been sober form alcohol from April 2018 until October 2018 and relapsed. For the last 4 weeks has been  drinking approximately 1.5 gallons of vodka weekly. Reports alcohol abuse for the last 15 years.  He doesn't feel as if he is having any current withdrawal symptoms. He denies any severe withdrawals in the past or DTs. Has a hx of one DUI. Denies any blackouts in the past.  Dog has been taken to a kennel and taken care of until he discharges  [facility] and stabilized with medication and therapy. Patient was started on the Ativan detox protocol and patinet chose to end it early and denied any withdrawal symptoms. Patient was continued on Gabapentin 300 mg TID, and started on Zoloft 50 mg Daily. Patient was offered Vistaril and Trazodone PRN during visit. Patient showed improvement with improved mood, affect, sleep., interaction, and appetite. Patient attended groups and participated. Patient was seen in the day room interacting appropriately with peers and staff. Patient agrees to follow up at Alcohol and drug Services. He also states he will start attending AA meetings. Patient denies any SI/HI/AVH and contracts for safety. Patient is provided with samples and prescriptions for his medications upon discharge.    Physical Findings: AIMS: Facial and Oral Movements Muscles of Facial Expression: None, normal Lips and Perioral Area: None, normal Jaw: None, normal Tongue: None, normal,Extremity Movements Upper (arms, wrists, hands, fingers): None, normal Lower (legs, knees, ankles, toes): None, normal, Trunk Movements Neck, shoulders, hips: None, normal, Overall Severity Severity of abnormal movements (highest score from questions above): None, normal Incapacitation due to abnormal movements: None, normal Patient's awareness of abnormal movements (rate only patient's report): No Awareness, Dental Status Current problems with teeth and/or dentures?: No Does patient usually wear dentures?: No  CIWA:  CIWA-Ar Total: 0 COWS:     Musculoskeletal: Strength & Muscle  Tone: within normal limits Gait & Station: normal Patient leans: N/A  Psychiatric Specialty Exam: Physical Exam  Nursing note and vitals reviewed. Constitutional: He is oriented to person, place, and time. He appears well-developed and well-nourished.  Cardiovascular: Normal rate.  Respiratory: Effort normal.  Musculoskeletal: Normal range of motion.  Neurological: He is alert and oriented to person, place, and time.  Skin: Skin is warm.    Review of Systems  Constitutional: Negative.   HENT: Negative.   Eyes: Negative.   Respiratory: Negative.   Cardiovascular: Negative.   Gastrointestinal: Negative.   Genitourinary: Negative.   Musculoskeletal: Negative.   Skin: Negative.   Neurological: Negative.   Endo/Heme/Allergies: Negative.   Psychiatric/Behavioral: Negative.     Blood pressure (!) 135/101, pulse 80, temperature 98 F (36.7 C), temperature source Oral, resp. rate 16, height 6\' 5"  (1.956 m), weight 122 kg (269 lb).Body mass index is 31.9 kg/m.  General Appearance: Casual  Eye Contact:  Good  Speech:  Clear and Coherent and Normal Rate  Volume:  Normal  Mood:  Euthymic  Affect:  Appropriate  Thought Process:  Goal Directed and Descriptions of Associations: Intact  Orientation:  Full (Time, Place, and  Person)  Thought Content:  WDL  Suicidal Thoughts:  No  Homicidal Thoughts:  No  Memory:  Immediate;   Good Recent;   Good Remote;   Good  Judgement:  Good  Insight:  Good  Psychomotor Activity:  Normal  Concentration:  Concentration: Good and Attention Span: Good  Recall:  Good  Fund of Knowledge:  Good  Language:  Good  Akathisia:  No  Handed:  Right  AIMS (if indicated):     Assets:  Communication Skills Desire for Improvement Financial Resources/Insurance Housing Physical Health Social Support Transportation  ADL's:  Intact  Cognition:  WNL  Sleep:  Number of Hours: 6.75     Have you used any form of tobacco in the last 30 days? (Cigarettes,  Smokeless Tobacco, Cigars, and/or Pipes): No  Has this patient used any form of tobacco in the last 30 days? (Cigarettes, Smokeless Tobacco, Cigars, and/or Pipes) Yes, No  Blood Alcohol level:  Lab Results  Component Value Date   ETH 321 (HH) 03/25/2017    Metabolic Disorder Labs:  No results found for: HGBA1C, MPG No results found for: PROLACTIN No results found for: CHOL, TRIG, HDL, CHOLHDL, VLDL, LDLCALC  See Psychiatric Specialty Exam and Suicide Risk Assessment completed by Attending Physician prior to discharge.  Discharge destination:  Home  Is patient on multiple antipsychotic therapies at discharge:  No   Has Patient had three or more failed trials of antipsychotic monotherapy by history:  No  Recommended Plan for Multiple Antipsychotic Therapies: NA   Allergies as of 03/28/2017   No Known Allergies     Medication List    TAKE these medications     Indication  aspirin EC 81 MG tablet Take 81 mg by mouth daily.  Indication:  Per PCP   gabapentin 300 MG capsule Commonly known as:  NEURONTIN Take 300 mg by mouth 3 (three) times daily.  Indication:  Neuropathic Pain   hydrOXYzine 25 MG tablet Commonly known as:  ATARAX/VISTARIL Take 1 tablet (25 mg total) by mouth every 6 (six) hours as needed (anxiety/agitatio).  Indication:  Feeling Anxious   lisinopril-hydrochlorothiazide 20-25 MG tablet Commonly known as:  PRINZIDE,ZESTORETIC Take 1 tablet by mouth daily.  Indication:  High Blood Pressure Disorder   metoprolol tartrate 25 MG tablet Commonly known as:  LOPRESSOR Take 25 mg by mouth 2 (two) times daily.  Indication:  High Blood Pressure Disorder   pravastatin 20 MG tablet Commonly known as:  PRAVACHOL Take 20 mg by mouth daily.  Indication:  High Amount of Fats in the Blood   sertraline 50 MG tablet Commonly known as:  ZOLOFT Take 1 tablet (50 mg total) by mouth daily. For mood control Start taking on:  03/29/2017  Indication:  mood stability    traZODone 50 MG tablet Commonly known as:  DESYREL Take 50 mg by mouth at bedtime as needed for sleep.  Indication:  Trouble Sleeping      Follow-up Information    Services, Alcohol And Drug Follow up.   Specialty:  Behavioral Health Why:  Walk in within 3 days of hospital discharge to be assessed for outpatient mental health services including individual therapy and medication management. Walk in hours: Mondays, Wednesdays, Fridays from 12:30PM-3:00PM. Thank you.  Contact information: [address] [place] [address] [phone]           Follow-up recommendations:  Continue activity as tolerated. Continue diet as recommended by your PCP. Ensure to keep all appointments  with outpatient providers.  Comments:  Patient is instructed prior to discharge to: Take all medications as prescribed by his/her mental healthcare provider. Report any adverse effects and or reactions from the medicines to his/her outpatient provider promptly. Patient has been instructed & cautioned: To not engage in alcohol and or illegal drug use while on prescription medicines. In the event of worsening symptoms, patient is instructed to call the crisis hotline, 911 and or go to the nearest ED for appropriate evaluation and treatment of symptoms. To follow-up with his/her primary care provider for your other medical issues, concerns and or health care needs.    Signed: Gerlene Burdock Money, FNP 03/28/2017, 9:25 AM   Patient seen, Suicide Assessment Completed.  Disposition Plan Reviewed

## 2017-03-28 NOTE — Progress Notes (Signed)
Patient verbalizes readiness for discharge. Follow up plan explained, AVS, transition record and SRA given along with prescriptions. Sample meds provided. All belongings returned. Suicide Safety Plan completed, on chart and copy given to patient. Patient verbalizes understanding. Denies SI/HI and assures this Clinical research associatewriter he will seek assistance should that change. Patient discharged ambulatory and in stable condition to his girlfriend.

## 2017-03-28 NOTE — Progress Notes (Signed)
D: Patient observed up and visible in the dayroom. Interactions appropriate with staff and peers. Patient states he feels ready for discharge. "I'm feeling so much better. No withdrawal. I don't need the ativan this morning." Patient's affect animated, mood appropriate. Per self inventory and discussions with writer, rates depression,  hopelessness at a 0/10 and anxiety at a 2/10 due "financial concerns." Rates sleep as good, appetite as good, energy as high and concentration as good.  States goal for today is "preparation to go home, attend sessions, comply with meds." Denies pain, physical complaints. BP and pulse remain elevated.   A: Medicated per orders, no prns required or requested. Level III obs in place for safety. Emotional support offered and self inventory reviewed. Encouraged completion of Suicide Safety Plan and programming participation. Discussed POC with MD, SW. Money, NP notified of VS. States he spoke with patient yesterday who does not wish to have antihypertensives altered. Will follow up with cardiologist.  R: Patient verbalizes understanding of POC. Patient denies SI/HI/AVH and remains safe on level III obs. Will continue to monitor closely and make verbal contact frequently. Awaiting discharge materials.

## 2017-03-28 NOTE — Progress Notes (Signed)
  [facility] Discharge Plan :  Will you be returning to the same living situation after discharge:  Yes,  good apartment, alone At discharge, do you have transportation home?: Yes,  pt able to arrnage Do you have the ability to pay for your medications: Yes,  getting samples, then will get insurance on 04/13/17  Release of information consent forms completed and turned in to Medical Records by CSW.  Patient to Follow up at: Follow-up Information    Services, Alcohol And Drug Follow up.   Specialty:  Behavioral Health Why:  Walk in within 3 days of hospital discharge to be assessed for outpatient mental health services including individual therapy and medication management. Walk in hours: Mondays, Wednesdays, Fridays from 12:30PM-3:00PM. Thank you.  Contact information: [address] [place] [address] [phone]           Next level of care provider has access to [facility] Link:no  Safety Planning and Suicide Prevention discussed: Yes,  with girlfriend  Have you used any form of tobacco in the last 30 days? (Cigarettes, Smokeless Tobacco, Cigars, and/or Pipes): No  Has patient been referred to the Quitline?: N/A patient is not a smoker  Patient has been referred for addiction treatment: Yes  Lynnell ChadMareida J Grossman-Orr, LCSW 03/28/2017, 9:23 AM

## 2017-03-28 NOTE — BHH Suicide Risk Assessment (Signed)
[facility]   Principal Problem: Major depressive disorder, recurrent severe without psychotic features [facility](HCC) Discharge Diagnoses:  Patient Active Problem List   Diagnosis Date Noted  . Alcohol dependence (HCC) [F10.20] 03/25/2017  . Major depressive disorder, recurrent severe without psychotic features (HCC) [F33.2] 03/25/2017    Total Time spent with patient: 30 minutes  Musculoskeletal: Strength & Muscle Tone: within normal limits- no tremors , no diaphoresis, no psychomotor agitation Gait & Station: normal Patient leans: N/A  Psychiatric Specialty Exam: ROS denies headache, denies visual disturbances,  denies chest pain, denies shortness of breath, denies vomiting   Blood pressure (!) 135/101, pulse 80, temperature 98 F (36.7 C), temperature source Oral, resp. rate 16, height 6\' 5"  (1.956 m), weight 122 kg (269 lb).Body mass index is 31.9 kg/m.  General Appearance: improved grooming   Eye Contact::  Good  Speech:  Normal Rate409  Volume:  Normal  Mood:  improved, states he is feeling "OK" today   Affect:  Appropriate and reactive   Thought Process:  Linear and Descriptions of Associations: Intact  Orientation:  Other:  fully alert and attentive, oriented x 3   Thought Content:  denies hallucinations, no delusions , not internally preoccupied   Suicidal Thoughts:  No denies suicidal or self injurious ideations, denies any homicidal or violent ideations   Homicidal Thoughts:  No  Memory:  recent and remote grossly intact   Judgement:  Other:  fair- improving   Insight:  Fair- improving  Psychomotor Activity:  Normal- no distal tremors, no diaphoresis, no psychomotor agitation or restlessness   Concentration:  Good  Recall:  Good  Fund of Knowledge:Good  Language: Good  Akathisia:  Negative  Handed:  Right  AIMS (if indicated):     Assets:  Communication Skills Desire for Improvement Resilience  Sleep:  Number of Hours: 6.75  Cognition: WNL   ADL's:  Intact   Mental Status Per Nursing Assessment::   On Admission:  NA  Demographic Factors:  56 year old male , divorced, lives alone , employed   Loss Factors: Recent relocation , alcohol relapse   Historical Factors: History of alcohol Dependence, history of depression which he attributes to alcohol. No history of suicide attempts  Risk Reduction Factors:   Employed and Positive coping skills or problem solving skills  Continued Clinical Symptoms:  At this time patient is alert, attentive, well related, pleasant on approach, calm, not in any acute distress or discomfort, no restlessness, no tremors, no agitation. States mood is "OK", minimizes depression at this time, affect is appropriate, reactive, no thought disorder, no suicidal or self injurious ideations, no homicidal or violent ideations, no psychotic symptoms, future oriented .  Reports mood is improved and states depression is at least partially alcohol induced, and now improved in the context of sobriety.  As above, does not currently endorse or present with significant alcohol WDL symptoms. No disruptive or agitated behaviors on unit, pleasant on approach. Patient is requesting for discharge today, stating he needs to back at work tomorrow .   Cognitive Features That Contribute To Risk:  No gross cognitive deficits noted upon discharge. Is alert , attentive, and oriented x 3   Suicide Risk:  Mild:  Suicidal ideation of limited frequency, intensity, duration, and specificity.  There are no identifiable plans, no associated intent, mild dysphoria and related symptoms, good self-control (both objective and subjective assessment), few other risk factors, and identifiable protective factors, including available and accessible social support.  Follow-up Information    Services, Alcohol And Drug Follow up.   Specialty:  Behavioral Health Why:  Walk in within 3 days of hospital discharge to be assessed for outpatient  mental health services including individual therapy and medication management. Walk in hours: Mondays, Wednesdays, Fridays from 12:30PM-3:00PM. Thank you.  Contact information: [address] [place] [address] [phone]           Plan Of Care/Follow-up recommendations:  Activity:  as tolerated  Diet:  Heart Healthy Tests:  NA Other:  See below  Patient is requesting discharge, and there are no current grounds for involuntary commitment  Plans to return home, plans to return to work soon after discharge Denies cravings for alcohol at this time- we reviewed importance of abstinence as integral part of treatment goals, and recommended he consider regular AA participation. [facility] -plans to follow up with his PCP for medical management as needed .   Craige CottaFernando A Kailynne Ferrington, MD 03/28/2017, 1:40 PM

## 2017-03-28 NOTE — BHH Group Notes (Signed)
BHH Group Notes:  (Nursing/MHT/Case Management/Adjunct)  Date:  03/28/2017  Time:  1030  Type of Therapy:  Nurse Education - Suicide Safety Plan  Participation Level:  Did Not Attend  Participation Quality:    Affect:    Cognitive:    Insight:    Engagement in Group:    Modes of Intervention:    Summary of Progress/Problems: Patient was invited to attend however had completed SSP earlier this morning.   Merian CapronFriedman, Aubriel Khanna [facility] 03/28/2017, 11:17 AM

## 2017-03-28 NOTE — BHH Group Notes (Signed)
BHH Group Notes: (Clinical Social Work)   03/28/2017      Type of Therapy:  Group Therapy   Participation Level:  Did Not Attend despite MHT prompting - stated he wanted to wait for the phone call saying his girlfriend was on her way to get him.   Ambrose MantleMareida Grossman-Orr, LCSW 03/28/2017, 3:51 PM

## 2018-04-17 ENCOUNTER — Inpatient Hospital Stay (HOSPITAL_COMMUNITY)
Admission: EM | Admit: 2018-04-17 | Discharge: 2018-04-18 | DRG: 894 | Discharge status: Against Medical Advice | Payer: Self-pay | Attending: Family Medicine | Admitting: Family Medicine

## 2018-04-17 ENCOUNTER — Other Ambulatory Visit: Payer: Self-pay

## 2018-04-17 ENCOUNTER — Encounter (HOSPITAL_COMMUNITY): Payer: Self-pay

## 2018-04-17 ENCOUNTER — Emergency Department (HOSPITAL_COMMUNITY): Payer: Self-pay

## 2018-04-17 DIAGNOSIS — F1020 Alcohol dependence, uncomplicated: Secondary | ICD-10-CM | POA: Diagnosis present

## 2018-04-17 DIAGNOSIS — Z7982 Long term (current) use of aspirin: Secondary | ICD-10-CM

## 2018-04-17 DIAGNOSIS — W19XXXA Unspecified fall, initial encounter: Secondary | ICD-10-CM | POA: Diagnosis present

## 2018-04-17 DIAGNOSIS — Z79899 Other long term (current) drug therapy: Secondary | ICD-10-CM

## 2018-04-17 DIAGNOSIS — E876 Hypokalemia: Secondary | ICD-10-CM

## 2018-04-17 DIAGNOSIS — G251 Drug-induced tremor: Secondary | ICD-10-CM | POA: Diagnosis present

## 2018-04-17 DIAGNOSIS — F332 Major depressive disorder, recurrent severe without psychotic features: Secondary | ICD-10-CM | POA: Diagnosis present

## 2018-04-17 DIAGNOSIS — E46 Unspecified protein-calorie malnutrition: Secondary | ICD-10-CM | POA: Diagnosis present

## 2018-04-17 DIAGNOSIS — F19939 Other psychoactive substance use, unspecified with withdrawal, unspecified: Secondary | ICD-10-CM | POA: Diagnosis present

## 2018-04-17 DIAGNOSIS — N179 Acute kidney failure, unspecified: Secondary | ICD-10-CM

## 2018-04-17 DIAGNOSIS — E785 Hyperlipidemia, unspecified: Secondary | ICD-10-CM | POA: Diagnosis present

## 2018-04-17 DIAGNOSIS — M21371 Foot drop, right foot: Secondary | ICD-10-CM | POA: Diagnosis present

## 2018-04-17 DIAGNOSIS — Z886 Allergy status to analgesic agent status: Secondary | ICD-10-CM

## 2018-04-17 DIAGNOSIS — E871 Hypo-osmolality and hyponatremia: Secondary | ICD-10-CM

## 2018-04-17 DIAGNOSIS — R42 Dizziness and giddiness: Secondary | ICD-10-CM

## 2018-04-17 DIAGNOSIS — G4700 Insomnia, unspecified: Secondary | ICD-10-CM | POA: Diagnosis present

## 2018-04-17 DIAGNOSIS — E119 Type 2 diabetes mellitus without complications: Secondary | ICD-10-CM | POA: Diagnosis present

## 2018-04-17 DIAGNOSIS — Z888 Allergy status to other drugs, medicaments and biological substances status: Secondary | ICD-10-CM

## 2018-04-17 DIAGNOSIS — F19239 Other psychoactive substance dependence with withdrawal, unspecified: Secondary | ICD-10-CM

## 2018-04-17 DIAGNOSIS — F10230 Alcohol dependence with withdrawal, uncomplicated: Secondary | ICD-10-CM

## 2018-04-17 DIAGNOSIS — F10939 Alcohol use, unspecified with withdrawal, unspecified: Secondary | ICD-10-CM | POA: Diagnosis present

## 2018-04-17 DIAGNOSIS — F10239 Alcohol dependence with withdrawal, unspecified: Secondary | ICD-10-CM | POA: Diagnosis present

## 2018-04-17 DIAGNOSIS — R197 Diarrhea, unspecified: Secondary | ICD-10-CM | POA: Diagnosis not present

## 2018-04-17 DIAGNOSIS — R296 Repeated falls: Secondary | ICD-10-CM | POA: Diagnosis present

## 2018-04-17 DIAGNOSIS — G839 Paralytic syndrome, unspecified: Secondary | ICD-10-CM | POA: Diagnosis present

## 2018-04-17 DIAGNOSIS — E860 Dehydration: Secondary | ICD-10-CM | POA: Diagnosis present

## 2018-04-17 DIAGNOSIS — F10231 Alcohol dependence with withdrawal delirium: Principal | ICD-10-CM | POA: Diagnosis present

## 2018-04-17 DIAGNOSIS — I10 Essential (primary) hypertension: Secondary | ICD-10-CM | POA: Diagnosis present

## 2018-04-17 LAB — URINALYSIS, ROUTINE W REFLEX MICROSCOPIC
BACTERIA UA: NONE SEEN
Bilirubin Urine: NEGATIVE
Glucose, UA: NEGATIVE mg/dL
Ketones, ur: 5 mg/dL — AB
Leukocytes, UA: NEGATIVE
Nitrite: NEGATIVE
PROTEIN: 100 mg/dL — AB
SPECIFIC GRAVITY, URINE: 1.019 (ref 1.005–1.030)
pH: 6 (ref 5.0–8.0)

## 2018-04-17 LAB — CBG MONITORING, ED: Glucose-Capillary: 102 mg/dL — ABNORMAL HIGH (ref 70–99)

## 2018-04-17 LAB — COMPREHENSIVE METABOLIC PANEL
ALBUMIN: 3.6 g/dL (ref 3.5–5.0)
ALT: 75 U/L — AB (ref 0–44)
AST: 106 U/L — AB (ref 15–41)
Alkaline Phosphatase: 58 U/L (ref 38–126)
Anion gap: 14 (ref 5–15)
BUN: 28 mg/dL — AB (ref 6–20)
CHLORIDE: 89 mmol/L — AB (ref 98–111)
CO2: 23 mmol/L (ref 22–32)
CREATININE: 2.12 mg/dL — AB (ref 0.61–1.24)
Calcium: 8.3 mg/dL — ABNORMAL LOW (ref 8.9–10.3)
GFR calc Af Amer: 39 mL/min — ABNORMAL LOW (ref >60)
GFR calc non Af Amer: 34 mL/min — ABNORMAL LOW (ref >60)
GLUCOSE: 108 mg/dL — AB (ref 70–99)
POTASSIUM: 3.2 mmol/L — AB (ref 3.5–5.1)
Sodium: 126 mmol/L — ABNORMAL LOW (ref 135–145)
Total Bilirubin: 1.2 mg/dL (ref 0.3–1.2)
Total Protein: 8.1 g/dL (ref 6.5–8.1)

## 2018-04-17 LAB — ETHANOL

## 2018-04-17 LAB — CBC
HEMATOCRIT: 38.5 % — AB (ref 39.0–52.0)
HEMOGLOBIN: 13.5 g/dL (ref 13.0–17.0)
MCH: 31.3 pg (ref 26.0–34.0)
MCHC: 35.1 g/dL (ref 30.0–36.0)
MCV: 89.1 fL (ref 80.0–100.0)
Platelets: 139 10*3/uL — ABNORMAL LOW (ref 150–400)
RBC: 4.32 MIL/uL (ref 4.22–5.81)
RDW: 13.9 % (ref 11.5–15.5)
WBC: 6.4 10*3/uL (ref 4.0–10.5)
nRBC: 0 % (ref 0.0–0.2)

## 2018-04-17 LAB — RAPID URINE DRUG SCREEN, HOSP PERFORMED
AMPHETAMINES: NOT DETECTED
BENZODIAZEPINES: NOT DETECTED
Barbiturates: NOT DETECTED
COCAINE: NOT DETECTED
OPIATES: NOT DETECTED
TETRAHYDROCANNABINOL: NOT DETECTED

## 2018-04-17 MED ORDER — POTASSIUM CHLORIDE CRYS ER 20 MEQ PO TBCR
40.0000 meq | EXTENDED_RELEASE_TABLET | Freq: Once | ORAL | Status: AC
  Administered 2018-04-17: 40 meq via ORAL
  Filled 2018-04-17: qty 2

## 2018-04-17 MED ORDER — THIAMINE HCL 100 MG/ML IJ SOLN
Freq: Once | INTRAVENOUS | Status: AC
  Administered 2018-04-17: 20:00:00 via INTRAVENOUS
  Filled 2018-04-17: qty 1000

## 2018-04-17 NOTE — ED Provider Notes (Signed)
[facility] EMERGENCY DEPARTMENT Provider Note   CSN: [id number] Arrival date & time: 04/17/18  1809   History   Chief Complaint Chief Complaint  Patient presents with  . Loss of Consciousness  . Weakness    HPI Joanthan Bada is a 58 y.o. male. PMH of HTN, alcohol use disorder, MDD, GERD.   Patient reports that he stopped drinking on 04/08/2018. Prior to stopping he was drinking 3-4 oz of liquor 6-8x per day. He reports that initially he felt bad and was having some nausea and vomiting but hat resolved. Then yesterday (9 days after reporting stopping) patient states that he started to have trouble walking. He would go from sitting to standing and then would get lightheaded. He fell three times and reports that he is unsure if he hit his head. His first fall was going from sitting to standing and he felt lightheaded and hit the floor. His second was that he went from sitting to standing and got about 6 steps before he lost consciousness and fell. For the third fall, he was on the toilet experiencing diarrhea when he passed out on the toilet.   Patient concerned because he thinks he has developed a foot drop on the right which is new, and he reports he had some weakness of his left arm transiently yesterday which has also resolved. He is also having hallucinations where he is seeing colors and insects around his food and drinks. Patient states he is very certain of the date he stopped drinking. Denies any previous DT's from alcohol withdrawal as he has tried to quit before.   The history is provided by the patient.    Past Medical History:  Diagnosis Date  . GERD (gastroesophageal reflux disease)   . Hypertension     Patient Active Problem List   Diagnosis Date Noted  . Hyponatremia 04/17/2018  . Alcohol dependence (HCC) 03/25/2017  . 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 open heart surgery  2010      Home Medications    Prior to Admission medications   Medication Sig Start Date End Date Taking? Authorizing Provider  aspirin EC 81 MG tablet Take 81 mg by mouth daily.   Yes [provider]  gabapentin (NEURONTIN) 300 MG capsule Take 300 mg by mouth 2 (two) times daily.    Yes [provider]  hydrOXYzine (ATARAX/VISTARIL) 25 MG tablet Take 1 tablet (25 mg total) by mouth every 6 (six) hours as needed (anxiety/agitatio). Patient taking differently: Take 25 mg by mouth every 6 (six) hours as needed for anxiety (agitation).  03/28/17  Yes Money, Gerlene Burdock, FNP  ibuprofen (ADVIL,MOTRIN) 200 MG tablet Take 400 mg by mouth every 6 (six) hours as needed for mild pain.   Yes [provider]  lisinopril-hydrochlorothiazide (PRINZIDE,ZESTORETIC) 20-25 MG tablet Take 0.5 tablets by mouth 2 (two) times daily.    Yes [provider]  metoprolol tartrate (LOPRESSOR) 25 MG tablet Take 25 mg by mouth 2 (two) times daily.   Yes [provider]  pravastatin (PRAVACHOL) 20 MG tablet Take 20 mg by mouth daily.   Yes [provider]  sertraline (ZOLOFT) 50 MG tablet Take 1 tablet (50 mg total) by mouth daily. For mood control 03/29/17  Yes Money, Gerlene Burdock, FNP  traZODone (DESYREL) 50 MG tablet Take 50 mg by mouth at bedtime as needed for sleep.  Yes [provider]    Family History History reviewed. No pertinent family history.  Social History Social History   Tobacco Use  . Smoking status: Never Smoker  . Smokeless tobacco: Never Used  Substance Use Topics  . Alcohol use: Not Currently    Comment: pt reports recent alcohol detox  . Drug use: No    Allergies   Patient has no known allergies.  Review of Systems Review of Systems  Constitutional: Negative for chills and fever.  Respiratory: Negative for cough and shortness of breath.   Cardiovascular: Negative for chest pain.    Gastrointestinal: Positive for diarrhea. Negative for abdominal pain, constipation, nausea and vomiting.  Neurological: Positive for tremors, syncope, weakness and numbness. Negative for dizziness, seizures and speech difficulty.  All other systems reviewed and are negative.   Physical Exam Updated Vital Signs BP 105/85   Pulse 79   Temp 98.1 F (36.7 C) (Oral)   Resp 20   Ht 6\' 5"  (1.956 m)   Wt 120.2 kg   SpO2 96%   BMI 31.42 kg/m   Physical Exam Vitals signs and nursing note reviewed.  Constitutional:      Appearance: Normal appearance.  HENT:     Head: Normocephalic and atraumatic.     Nose: Nose normal.     Mouth/Throat:     Mouth: Mucous membranes are dry.     Pharynx: Oropharynx is clear.  Eyes:     General: No scleral icterus.    Conjunctiva/sclera: Conjunctivae normal.     Pupils: Pupils are equal, round, and reactive to light.  Neck:     Musculoskeletal: Normal range of motion and neck supple.  Cardiovascular:     Rate and Rhythm: Normal rate and regular rhythm.     Pulses: Normal pulses.     Heart sounds: Normal heart sounds.  Pulmonary:     Effort: Pulmonary effort is normal.     Breath sounds: Normal breath sounds.  Abdominal:     General: Bowel sounds are normal.     Palpations: Abdomen is soft.  Skin:    General: Skin is warm.  Neurological:     General: No focal deficit present.     Mental Status: He is alert and oriented to person, place, and time. Mental status is at baseline.     Cranial Nerves: Cranial nerves are intact. No cranial nerve deficit or facial asymmetry.     Sensory: Sensation is intact.     Motor: Tremor present.    ED Treatments / Results  Labs (all labs ordered are listed, but only abnormal results are displayed) Labs Reviewed  COMPREHENSIVE METABOLIC PANEL - Abnormal; Notable for the following components:      Result Value   Sodium 126 (*)    Potassium 3.2 (*)    Chloride 89 (*)    Glucose, Bld 108 (*)    BUN 28 (*)     Creatinine, Ser 2.12 (*)    Calcium 8.3 (*)    AST 106 (*)    ALT 75 (*)    GFR calc non Af Amer 34 (*)    GFR calc Af Amer 39 (*)    All other components within normal limits  CBC - Abnormal; Notable for the following components:   HCT 38.5 (*)    Platelets 139 (*)    All other components within normal limits  CBG MONITORING, ED - Abnormal; Notable for the following components:   Glucose-Capillary 102 (*)  All other components within normal limits  ETHANOL  RAPID URINE DRUG SCREEN, HOSP PERFORMED  URINALYSIS, ROUTINE W REFLEX MICROSCOPIC  VITAMIN B1    EKG EKG Interpretation  Date/Time:  Sunday April 17 2018 18:16:08 EST Ventricular Rate:  73 PR Interval:    QRS Duration: 118 QT Interval:  390 QTC Calculation: 430 R Axis:   34 Text Interpretation:  Sinus rhythm Probable left atrial enlargement Nonspecific intraventricular conduction delay Borderline repolarization abnormality Confirmed by Lorre Nick ([id number]) on 04/17/2018 6:56:10 PM   Radiology Ct Head Wo Contrast  Result Date: 04/17/2018 CLINICAL DATA:  Multiple syncopal episodes yesterday, generalized weakness, weakness in LEFT arm and RIGHT leg, history hypertension EXAM: CT HEAD WITHOUT CONTRAST CT CERVICAL SPINE WITHOUT CONTRAST TECHNIQUE: Multidetector CT imaging of the head and cervical spine was performed following the standard protocol without intravenous contrast. Multiplanar CT image reconstructions of the cervical spine were also generated. COMPARISON:  None FINDINGS: CT HEAD FINDINGS Brain: Mild generalized chest he atrophy. Normal ventricular morphology. No midline shift or mass effect. Otherwise normal appearance of brain parenchyma. No intracranial hemorrhage, mass lesion or evidence of acute infarction. No extra-axial fluid collections. Vascular: Minimal atherosclerotic calcification of internal carotid arteries at skull base. No hyperdense vessels. Skull: Intact Sinuses/Orbits: Clear Other: Mild nasal  septal deviation to the RIGHT. CT CERVICAL SPINE FINDINGS Alignment: Normal Skull base and vertebrae: Visualized skull base intact. Osseous mineralization grossly normal. Disc space narrowing with endplate spur formation at C5-C6. Vertebral body and disc space heights otherwise maintained. No fracture or subluxation. Mild scattered facet degenerative changes. Uncovertebral spurs encroach upon the C5-C6 neural foramina bilaterally. Soft tissues and spinal canal: Prevertebral soft tissues normal thickness. Atherosclerotic calcifications at the LEFT carotid bifurcation. Disc levels:  Mildly bulging C5-C6 disc. Upper chest: Tips of lung apices clear Other: N/A IMPRESSION: No acute intracranial abnormalities. Degenerative disc disease changes at C5-C6. No acute cervical spine abnormalities. Electronically Signed   By: Ulyses Southward M.D.   On: 04/17/2018 20:06   Ct Cervical Spine Wo Contrast  Result Date: 04/17/2018 CLINICAL DATA:  Multiple syncopal episodes yesterday, generalized weakness, weakness in LEFT arm and RIGHT leg, history hypertension EXAM: CT HEAD WITHOUT CONTRAST CT CERVICAL SPINE WITHOUT CONTRAST TECHNIQUE: Multidetector CT imaging of the head and cervical spine was performed following the standard protocol without intravenous contrast. Multiplanar CT image reconstructions of the cervical spine were also generated. COMPARISON:  None FINDINGS: CT HEAD FINDINGS Brain: Mild generalized chest he atrophy. Normal ventricular morphology. No midline shift or mass effect. Otherwise normal appearance of brain parenchyma. No intracranial hemorrhage, mass lesion or evidence of acute infarction. No extra-axial fluid collections. Vascular: Minimal atherosclerotic calcification of internal carotid arteries at skull base. No hyperdense vessels. Skull: Intact Sinuses/Orbits: Clear Other: Mild nasal septal deviation to the RIGHT. CT CERVICAL SPINE FINDINGS Alignment: Normal Skull base and vertebrae: Visualized skull base  intact. Osseous mineralization grossly normal. Disc space narrowing with endplate spur formation at C5-C6. Vertebral body and disc space heights otherwise maintained. No fracture or subluxation. Mild scattered facet degenerative changes. Uncovertebral spurs encroach upon the C5-C6 neural foramina bilaterally. Soft tissues and spinal canal: Prevertebral soft tissues normal thickness. Atherosclerotic calcifications at the LEFT carotid bifurcation. Disc levels:  Mildly bulging C5-C6 disc. Upper chest: Tips of lung apices clear Other: N/A IMPRESSION: No acute intracranial abnormalities. Degenerative disc disease changes at C5-C6. No acute cervical spine abnormalities. Electronically Signed   By: Ulyses Southward M.D.   On: 04/17/2018 20:06    Procedures  Procedures (including critical care time)  Medications Ordered in ED Medications  potassium chloride SA (K-DUR,KLOR-CON) CR tablet 40 mEq (has no administration in time range)  sodium chloride 0.9 % 1,000 mL with thiamine 100 mg, folic acid 1 mg, multivitamins adult 10 mL infusion ( Intravenous New Bag/Given 04/17/18 2002)   Initial Impression / Assessment and Plan / ED Course  I have reviewed the triage vital signs and the nursing notes.  Pertinent labs & imaging results that were available during my care of the patient were reviewed by me and considered in my medical decision making (see chart for details).   Patient tremulous on exam with no focal neurological exam findings- less concerning for acute stroke given alcohol history.  CIWA in ED was 7. Will obtain CMP, CBC, ETOH, thiamine, UDS and UA. Will obtain CT head and cervical spine given history of falls with patient unsure if he hit his head. Will give bolus 1L of banana bag and continue with 17650mL/hr.   CMP with elevated LFT's, hyponatremia and hypokalemia. Patient receiving banana bag and will also give 40meq of Kdur.  CT head and spine were negative for any acute findings.  Also with AKI with Cr  of 2.12 with last Cr on 03/25/17 of 1.09. Could be due to dehydration from recent volume loss with diarrhea and vomiting.   Will admit to unassigned for AKI, monitoring of alcohol withdrawal and further evaluation of patient's recent falls.   [place] Jonanthony Nahar, DO PGY-2, [facility]   Final Clinical Impressions(s) / ED Diagnoses   Final diagnoses:  Alcohol dependence with uncomplicated withdrawal (HCC)  Hyponatremia  Hypokalemia  AKI (acute kidney injury) [facility](HCC)    ED Discharge Orders    None       Aveon Colquhoun, [place], DO 04/17/18 2113    Lorre NickAllen, Anthony, MD 04/17/18 2324

## 2018-04-17 NOTE — ED Notes (Signed)
Nystagmus noted.   Additional complaint of seeing different colored circles and gnats flying around and in his drink.

## 2018-04-17 NOTE — ED Provider Notes (Signed)
I saw and evaluated the patient, reviewed the resident's note and I agree with the findings and plan.  EKG: EKG Interpretation  Date/Time:  Sunday April 17 2018 18:16:08 EST Ventricular Rate:  73 PR Interval:    QRS Duration: 118 QT Interval:  390 QTC Calculation: 430 R Axis:   34 Text Interpretation:  Sinus rhythm Probable left atrial enlargement Nonspecific intraventricular conduction delay Borderline repolarization abnormality Confirmed by Lorre Nick ([id number]) on 04/17/2018 6:60:14 PM 58 year old male who has history of alcohol abuse who reported his last drink several days ago states that he is been had recent volume loss which has been associated with syncope.  Has evidence of acute kidney injury here.  Will admit to the hospitalist service   Lorre Nick, MD 04/17/18 2001

## 2018-04-17 NOTE — Progress Notes (Signed)
Family Medicine Teaching Service Daily Progress Note Intern Pager: [phone]  Patient name: Benjamin Richard Medical record number: [id number] Date of birth: 05-Nov-1960 Age: 58 y.o. Gender: male  Primary Care Provider: Medicine, [facility] (Inactive) Consultants: none Code Status: full  Pt Overview and Major Events to Date:  04/17/18: admitted to progressive floor for unsteady gait and recurrent falls, AKI, hyponatremia in the setting of alcohol withdrawal  Assessment and Plan: Benjamin Richard is a 58 y.o. male presenting with 2 days of falls and right foot drop . PMH is significant for alcohol use disorder (last drink 04/08/2018 per pt).  Alcohol withdrawal with history of recurrent falls: Acute.  Negative CT head and cervical spine.  Did report some right foot drop which is mentioned in detail below, however this is improving.  Declined MRI due to cost.  Complete neuro exam reassuring as of present.  Currently drinks 30-35 ounces of vodka daily per patient report with last drink on 04/08/2018.  Interested in cessation.  History of behavioral therapy outpatient in the past but not interested due to frequent travels due to job.  CIWA scores have remained low at 7-11.  UDS negative.  EtOH level negative on arrival.  Hemodynamically stable.  Patient with positive orthostatics which are likely contributing to falls and unsteady gait. - Progressive floor, plan to transition to telemetry on 1/7 if stable - Continue CIWA protocol, Ativan PRN, banana bag, IVF, multivitamin - Thiamine and folate levels pending - Social work consulted, appreciate recommendations - Dietitian consulted, appreciate recommendations - Continuing home sertraline 50 mg daily and trazodone 50 mg daily  Right sided foot drop: Acute.  Improved.  Uncertain etiology, however alcohol use disorder likely contributing to palsy.  Negative head CT as mentioned above.  Declined MRI.  Appears to have slow and somewhat  unsteady gait but able to maintain balance without assistance. - See plan above for alcohol withdrawal - Low threshold for head MRI  Acute kidney injury: Improving.  Likely prerenal in the setting of recurrent diarrhea following cessation of alcohol.  No oligouria.  Creatinine 1.82 down from 2.12 (BL 1.0).  Currently on ACE inhibitor at home. - IV fluids - Holding home lisinopril, gabapentin and avoid nephrotoxic agents - Monitor renal function  Hyponatremia: Likely chronic and secondary to alcohol use disorder.  Improved.  Up 129 from 126.  Neuro exam reassuring.  No history of seizure. - See plan for alcohol withdrawal - Daily electrolytes  Hypokalemia: Acute.  Improved.  Up to 3.4 from 3.2.  Likely secondary to alcohol use disorder. - Continue repletion - Checking magnesium and other electrolytes  Hypertension: Chronic.  Stable.  Currently with AKI. - Holding home BP meds - Hydralazine 5 mg IV every 4 hours as needed - Continue aspirin 81 mg daily  Nonbloody diarrhea: Acute.  Likely secondary to alcohol withdrawal.  Did present with clinical presentation of dehydration based on AKI and positive orthostatics. - Continue rehydration per above  History of diabetes: Diet controlled.  Also has concomitant hyperlipidemia. - Checking A1c and lipid panel along with TSH  FEN/GI: Maintenance IVF with NS for 12 hours, renal diet with fluid restriction 1200 mL PPx: Heparin  Disposition: Pending observation for alcohol withdrawal with hopeful transition to telemetry unit on 1/7.  Awaiting PT/OT evaluation.  Subjective:  Patient feeling okay this morning.  Denies shortness of breath, chest pain, nausea or vomiting, abdominal pain.  He was able to tolerate breakfast.  Has not yet walked around arriving to  the ED.  States that his right-sided dropfoot has significantly improved.  Denies motor weakness, change in vision, dysarthria, or facial droop.  Objective: Temp:  [98.1 F (36.7  C)-98.8 F (37.1 C)] 98.8 F (37.1 C) (01/06 0235) Pulse Rate:  [70-92] 84 (01/06 1606) Resp:  [13-28] 18 (01/06 1606) BP: (105-147)/(72-108) 144/101 (01/06 1606) SpO2:  [95 %-100 %] 97 % (01/06 1606) Weight:  [120.2 kg] 120.2 kg (01/05 1823)   General: lying in bed comfortably, well nourished, well developed, NAD with non-toxic appearance HEENT: normocephalic, atraumatic, moist mucous membranes Cardiovascular: regular rate and rhythm without murmurs, rubs, or gallops Lungs: clear to auscultation bilaterally with normal work of breathing Abdomen: soft, non-tender, non-distended, normoactive bowel sounds Skin: warm, dry, no rashes or lesions, cap refill < 2 seconds Extremities: warm and well perfused, normal tone, no edema, 5/5 motor strength in all 4 extremities Neuro: CNII-XII intact, no facial droop, no dysarthria, fine motor intact, slow and somewhat unsteady balance while ambulating without signs of foot drop, resting tremor present   Laboratory: Recent Labs  Lab 04/17/18 1845 04/18/18 0436  WBC 6.4 6.2  HGB 13.5 12.9*  HCT 38.5* 38.4*  PLT 139* 138*   Recent Labs  Lab 04/17/18 1845 04/18/18 0436  NA 126* 129*  K 3.2* 3.4*  CL 89* 94*  CO2 23 22  BUN 28* 28*  CREATININE 2.12* 1.82*  CALCIUM 8.3* 8.1*  PROT 8.1 7.7  BILITOT 1.2 1.2  ALKPHOS 58 56  ALT 75* 70*  AST 106* 95*  GLUCOSE 108* 83    Imaging/Diagnostic Tests: Ct Head Wo Contrast  Result Date: 04/17/2018 CLINICAL DATA:  Multiple syncopal episodes yesterday, generalized weakness, weakness in LEFT arm and RIGHT leg, history hypertension EXAM: CT HEAD WITHOUT CONTRAST CT CERVICAL SPINE WITHOUT CONTRAST TECHNIQUE: Multidetector CT imaging of the head and cervical spine was performed following the standard protocol without intravenous contrast. Multiplanar CT image reconstructions of the cervical spine were also generated. COMPARISON:  None FINDINGS: CT HEAD FINDINGS Brain: Mild generalized chest he atrophy.  Normal ventricular morphology. No midline shift or mass effect. Otherwise normal appearance of brain parenchyma. No intracranial hemorrhage, mass lesion or evidence of acute infarction. No extra-axial fluid collections. Vascular: Minimal atherosclerotic calcification of internal carotid arteries at skull base. No hyperdense vessels. Skull: Intact Sinuses/Orbits: Clear Other: Mild nasal septal deviation to the RIGHT. CT CERVICAL SPINE FINDINGS Alignment: Normal Skull base and vertebrae: Visualized skull base intact. Osseous mineralization grossly normal. Disc space narrowing with endplate spur formation at C5-C6. Vertebral body and disc space heights otherwise maintained. No fracture or subluxation. Mild scattered facet degenerative changes. Uncovertebral spurs encroach upon the C5-C6 neural foramina bilaterally. Soft tissues and spinal canal: Prevertebral soft tissues normal thickness. Atherosclerotic calcifications at the LEFT carotid bifurcation. Disc levels:  Mildly bulging C5-C6 disc. Upper chest: Tips of lung apices clear Other: N/A IMPRESSION: No acute intracranial abnormalities. Degenerative disc disease changes at C5-C6. No acute cervical spine abnormalities. Electronically Signed   By: Ulyses Southward M.D.   On: 04/17/2018 20:06   Ct Cervical Spine Wo Contrast  Result Date: 04/17/2018 CLINICAL DATA:  Multiple syncopal episodes yesterday, generalized weakness, weakness in LEFT arm and RIGHT leg, history hypertension EXAM: CT HEAD WITHOUT CONTRAST CT CERVICAL SPINE WITHOUT CONTRAST TECHNIQUE: Multidetector CT imaging of the head and cervical spine was performed following the standard protocol without intravenous contrast. Multiplanar CT image reconstructions of the cervical spine were also generated. COMPARISON:  None FINDINGS: CT HEAD FINDINGS Brain: Mild  generalized chest he atrophy. Normal ventricular morphology. No midline shift or mass effect. Otherwise normal appearance of brain parenchyma. No  intracranial hemorrhage, mass lesion or evidence of acute infarction. No extra-axial fluid collections. Vascular: Minimal atherosclerotic calcification of internal carotid arteries at skull base. No hyperdense vessels. Skull: Intact Sinuses/Orbits: Clear Other: Mild nasal septal deviation to the RIGHT. CT CERVICAL SPINE FINDINGS Alignment: Normal Skull base and vertebrae: Visualized skull base intact. Osseous mineralization grossly normal. Disc space narrowing with endplate spur formation at C5-C6. Vertebral body and disc space heights otherwise maintained. No fracture or subluxation. Mild scattered facet degenerative changes. Uncovertebral spurs encroach upon the C5-C6 neural foramina bilaterally. Soft tissues and spinal canal: Prevertebral soft tissues normal thickness. Atherosclerotic calcifications at the LEFT carotid bifurcation. Disc levels:  Mildly bulging C5-C6 disc. Upper chest: Tips of lung apices clear Other: N/A IMPRESSION: No acute intracranial abnormalities. Degenerative disc disease changes at C5-C6. No acute cervical spine abnormalities. Electronically Signed   By: Ulyses Southward M.D.   On: 04/17/2018 20:06     Wendee Beavers, DO 04/18/2018, 6:11 PM PGY-3, [facility] [facility] FPTS Intern pager: [phone], text pages welcome

## 2018-04-17 NOTE — ED Triage Notes (Signed)
Per [facility], pt from home w/ a c/o multiple syncopal episodes that occurred yesterday, generalized weakness, and weakness in his left arm and right leg. Additional complaints of anxiety and dizziness. All symptoms began upon waking yesterday at ~ 0700. EMS reported a neg stroke scale.   130/80 HR 78 RR 18 97% RA CBG 93  20 ga IV L wrist

## 2018-04-17 NOTE — ED Notes (Signed)
Pt reports recent self detox from alcohol 10 days ago. Stated he had multiple episodes of vomiting and diarrhea. The vomiting stopped but he has had persistent diarrhea. No blood noted in stool.

## 2018-04-17 NOTE — H&P (Addendum)
[facility] History and Physical Service Pager: [phone]  Patient name: Benjamin PedroMarcel Tober Medical record number: [id number] Date of birth: 01-23-1961 Age: 58 y.o. Gender: male  Primary Care Provider: Medicine, [facility] (Inactive) Consultants: none Code Status: full  Chief Complaint: "falling"  Assessment and Plan: Benjamin Richard is a 58 y.o. male presenting with 2 days of falls and right foot drop . PMH is significant for alcohol use disorder with cold-turkey quit on 04/08/2018.  Unsteady gait - with falls x4. has been hospitalized twice in past for alcohol withdrawals. Has no history of seizures with withdrawals. Last drink 04/08/2018. Prior to quitting was drinking 30-35oz vodka with orange juice per day. vitals on admission: HR 72, RR 21, BP 122/81, O2 97% on room air. labs: ethanol <10, AST 106, ALT 75, vitamin B1 pending, UDS negative, urinalysis shows signs of dehydration imaging: CT head/neck negative acute changes. physical exam: alert and oriented, horizontal nystagmus present, asterixis negative, positive romberg sign, tremor in bilateral hands present. in the ED: CIWA 7. Given potassium 40mEq, "banana bag". Recent history of falls/hallucinations likely 2/2 alcohol withdrawals. Current symptoms probably contributed by malnutrition from chronic alcohol use disorder and dehydration 2/2 diarrhea. Although unsteady gait and dysmetria can be contributed to by alcohol withdrawal, need to exclude underlying cerebral pathology, will order MRI. Additionally, patient reports "foot drop," but we were unable to appreciate gait in ED.  - admit to stepdown unit, attending Dr. McDiarmid - add IV thiamine high dose - MRI brain stat - CIWA protocol monitoring - benzos PRN - repeat CMP tonight - repeat CMP am tomorrow - APTT, PT, INR am - orthostatic vital signs  - f/u on patient's hallucinations - f/u thiamine folate levels and replete as  appropriate - social work consult for support of alcohol cessation  AKI- on admission, creatinine 2.12 (bl ~1), BUN 28. Received liter of fluid in ED. Likely 2/2 dehydration [facility], exacerbated by taking ACEi at home.  - avoid nephrotoxic agents - encourage oral hydration - IV fluids as needed - holding home gabapentin and BP meds  Hyponatremia- Na+ on admission is 126. Normal at baseline. Patient does not complain of N/V, headache. He appears to be alert and oriented without confusion. Given "banana bag" in ED. Patient was drinking fluids during exam. Monitor closely since signs of hyponatremia may be confused with signs of alcohol withdrawal - recheck BMP tonight - check BMP in am - monitor for signs of sedation or seizure  Hypokalemia- K+ 3.2 on admission. Was given 40mEq in ED. Will be rechecked tonight and in am. ECG on admission was NSR and possibly low volume t-waves. - f/u repeat CMP - replete as needed - ECG am - check mag  HTN- home meds: lisinopril-HCTZ BID, lopressor 35 BID. Patient took them this morning. Readings have been in normal range since admission.  - holding home BP medications - PRN hydral ordered for severe range pressures - monitor vitals per floor protocol  Diarrhea- about 3-4 loose stools per day since stopping alcohol - continue oral hydration  Psych- home meds: sertraline 50mg  daily, trazodone 50mg  PRN - continue home meds  FEN/GI: heart healthy diet Prophylaxis: heparin  Disposition: step down unit overnight  History of Present Illness:  Benjamin PedroMarcel Micucci is a 58 y.o. male presenting with 2 days of falls- two yesterday and two today. Patient denies any injury from falls other than bruises. Denies LOC. Describes as similar sensation to when he  had positive orthostatics many years ago. 3/4 episodes occurred while rising and ambulating in house. 1/4 occurred on toilet while having BM but patient denies any straining at the  time. During the falls, he endorses weakness all over and sensation of "unbalanced". Denies vision changes, dizziness, legs giving out. He has been drinking water and gatorade to compensate for multiple episodes of loose stool per day since stopping drinking alcohol. States his last drink was 04/08/2018. Prior to that, he was drinking 30-35oz vodka every day. The first few days after quitting he experienced profuse sweating, insomnia, coughing with post-tussive emesis, diarrhea. For the past two days he has had the 4 falls and also felt that he had a right foot drop that he would have to drag his foot when he is walking. He was worried he was having mini strokes. He has also been experiencing intermittent hallucinations of "orange rings floating" and "gnats" flying in his drink etc. He is aware that these things are not real. Prior to quitting drinking, he felt fine. He had no adverse reactions and just decided he'd had enough of alcohol.   Review Of Systems: Per HPI with the following additions:  Review of Systems  Constitutional: Positive for diaphoresis. Negative for fever.  Respiratory: Positive for cough.   Gastrointestinal: Positive for diarrhea and vomiting.  Musculoskeletal: Positive for falls.  Neurological: Positive for dizziness, sensory change and weakness.  Psychiatric/Behavioral: Positive for hallucinations. Negative for substance abuse.    Patient Active Problem List   Diagnosis Date Noted  . Alcohol dependence (HCC) 03/25/2017  . Major depressive disorder, recurrent severe without psychotic features (HCC) 03/25/2017    Past Medical History: Past Medical History:  Diagnosis Date  . GERD (gastroesophageal reflux disease)   . Hypertension     Past Surgical History: Past Surgical History:  Procedure Laterality Date  . open heart surgery  2010    Social History: Social History   Tobacco Use  . Smoking status: Never Smoker  . Smokeless tobacco: Never Used   Substance Use Topics  . Alcohol use: Not Currently    Comment: pt reports recent alcohol detox  . Drug use: No   Additional social history: no tobacco, no illicit drugs, alcohol use in HPI and plan  Please also refer to relevant sections of EMR.  Family History: History reviewed. No pertinent family history.  Allergies and Medications: No Known Allergies No current facility-administered medications on file prior to encounter.    Current Outpatient Medications on File Prior to Encounter  Medication Sig Dispense Refill  . aspirin EC 81 MG tablet Take 81 mg by mouth daily.    Marland Kitchen gabapentin (NEURONTIN) 300 MG capsule Take 300 mg by mouth 2 (two) times daily.     . hydrOXYzine (ATARAX/VISTARIL) 25 MG tablet Take 1 tablet (25 mg total) by mouth every 6 (six) hours as needed (anxiety/agitatio). (Patient taking differently: Take 25 mg by mouth every 6 (six) hours as needed for anxiety (agitation). ) 30 tablet 0  . ibuprofen (ADVIL,MOTRIN) 200 MG tablet Take 400 mg by mouth every 6 (six) hours as needed for mild pain.    Marland Kitchen lisinopril-hydrochlorothiazide (PRINZIDE,ZESTORETIC) 20-25 MG tablet Take 0.5 tablets by mouth 2 (two) times daily.     . metoprolol tartrate (LOPRESSOR) 25 MG tablet Take 25 mg by mouth 2 (two) times daily.    . pravastatin (PRAVACHOL) 20 MG tablet Take 20 mg by mouth daily.    . sertraline (ZOLOFT) 50 MG tablet Take 1 tablet (  50 mg total) by mouth daily. For mood control 30 tablet 0  . traZODone (DESYREL) 50 MG tablet Take 50 mg by mouth at bedtime as needed for sleep.      Objective: BP 105/85   Pulse 79   Temp 98.1 F (36.7 C) (Oral)   Resp 20   Ht 6\' 5"  (1.956 m)   Wt 120.2 kg   SpO2 96%   BMI 31.42 kg/m  Exam: General: non-toxic, cooperative with exam, general tremor worse in hands at rest and essential  Eyes: no scleral icterus, positive horizontal nystagmus Cardiovascular: RRR, no murmur appreciated Respiratory: CTAB, no wheezing or increased  WOB Gastrointestinal: positive BS diffusely, soft, obese abdomen. No hepatomegaly palpated.  MSK: normal development Derm: ecchymosis on left UE Neuro: negative finger-to-nose. Positive romberg. Strength in bilateral LE 5/5. Sensation intact Psych: pleasant mood  Labs and Imaging: CBC BMET  Recent Labs  Lab 04/17/18 1845  WBC 6.4  HGB 13.5  HCT 38.5*  PLT 139*   Recent Labs  Lab 04/17/18 1845  NA 126*  K 3.2*  CL 89*  CO2 23  BUN 28*  CREATININE 2.12*  GLUCOSE 108*  CALCIUM 8.3*     Ethanol <10 B1 pending UDS negative  Leeroy Bocknderson, Chelsey L, DO 04/17/2018, 8:58 PM PGY-1, [place] [facility] FPTS Intern pager: [phone], text pages welcome  FPTS Upper-Level Resident Addendum   I have independently interviewed and examined the patient. I have discussed the above with the original author and agree with their documentation. My edits for correction/addition/clarification are in blue. Please see also any attending notes.    Loni MuseKate Vassie Kugel, MD PGY-3, [facility] FPTS Service pager: [phone] (text pages welcome through [facility])

## 2018-04-18 ENCOUNTER — Inpatient Hospital Stay (HOSPITAL_COMMUNITY)
Admission: EM | Admit: 2018-04-18 | Discharge: 2018-04-21 | DRG: 897 | Disposition: A | Discharge status: Home / Self Care | Payer: Self-pay | Attending: Internal Medicine | Admitting: Internal Medicine

## 2018-04-18 ENCOUNTER — Encounter (HOSPITAL_COMMUNITY): Payer: Self-pay | Admitting: Internal Medicine

## 2018-04-18 DIAGNOSIS — E860 Dehydration: Secondary | ICD-10-CM | POA: Diagnosis present

## 2018-04-18 DIAGNOSIS — F10939 Alcohol use, unspecified with withdrawal, unspecified: Secondary | ICD-10-CM | POA: Diagnosis present

## 2018-04-18 DIAGNOSIS — Z7982 Long term (current) use of aspirin: Secondary | ICD-10-CM

## 2018-04-18 DIAGNOSIS — R410 Disorientation, unspecified: Secondary | ICD-10-CM

## 2018-04-18 DIAGNOSIS — F10239 Alcohol dependence with withdrawal, unspecified: Secondary | ICD-10-CM | POA: Diagnosis present

## 2018-04-18 DIAGNOSIS — N179 Acute kidney failure, unspecified: Secondary | ICD-10-CM | POA: Diagnosis present

## 2018-04-18 DIAGNOSIS — Z9181 History of falling: Secondary | ICD-10-CM

## 2018-04-18 DIAGNOSIS — F10230 Alcohol dependence with withdrawal, uncomplicated: Secondary | ICD-10-CM

## 2018-04-18 DIAGNOSIS — R197 Diarrhea, unspecified: Secondary | ICD-10-CM | POA: Diagnosis not present

## 2018-04-18 DIAGNOSIS — E876 Hypokalemia: Secondary | ICD-10-CM | POA: Diagnosis present

## 2018-04-18 DIAGNOSIS — M21371 Foot drop, right foot: Secondary | ICD-10-CM | POA: Diagnosis present

## 2018-04-18 DIAGNOSIS — K219 Gastro-esophageal reflux disease without esophagitis: Secondary | ICD-10-CM | POA: Diagnosis present

## 2018-04-18 DIAGNOSIS — E871 Hypo-osmolality and hyponatremia: Secondary | ICD-10-CM | POA: Diagnosis present

## 2018-04-18 DIAGNOSIS — R740 Nonspecific elevation of levels of transaminase and lactic acid dehydrogenase [LDH]: Secondary | ICD-10-CM | POA: Diagnosis present

## 2018-04-18 DIAGNOSIS — I10 Essential (primary) hypertension: Secondary | ICD-10-CM | POA: Diagnosis present

## 2018-04-18 DIAGNOSIS — F19239 Other psychoactive substance dependence with withdrawal, unspecified: Secondary | ICD-10-CM

## 2018-04-18 DIAGNOSIS — F23 Brief psychotic disorder: Secondary | ICD-10-CM | POA: Diagnosis present

## 2018-04-18 DIAGNOSIS — Z79899 Other long term (current) drug therapy: Secondary | ICD-10-CM

## 2018-04-18 DIAGNOSIS — G251 Drug-induced tremor: Secondary | ICD-10-CM | POA: Diagnosis present

## 2018-04-18 DIAGNOSIS — F10231 Alcohol dependence with withdrawal delirium: Principal | ICD-10-CM | POA: Diagnosis present

## 2018-04-18 DIAGNOSIS — Z046 Encounter for general psychiatric examination, requested by authority: Secondary | ICD-10-CM

## 2018-04-18 DIAGNOSIS — F10931 Alcohol use, unspecified with withdrawal delirium: Secondary | ICD-10-CM | POA: Diagnosis present

## 2018-04-18 DIAGNOSIS — R451 Restlessness and agitation: Secondary | ICD-10-CM

## 2018-04-18 DIAGNOSIS — R42 Dizziness and giddiness: Secondary | ICD-10-CM

## 2018-04-18 DIAGNOSIS — Z781 Physical restraint status: Secondary | ICD-10-CM

## 2018-04-18 DIAGNOSIS — F19939 Other psychoactive substance use, unspecified with withdrawal, unspecified: Secondary | ICD-10-CM | POA: Diagnosis present

## 2018-04-18 DIAGNOSIS — W19XXXA Unspecified fall, initial encounter: Secondary | ICD-10-CM | POA: Diagnosis present

## 2018-04-18 DIAGNOSIS — R000 Tachycardia, unspecified: Secondary | ICD-10-CM

## 2018-04-18 DIAGNOSIS — D649 Anemia, unspecified: Secondary | ICD-10-CM | POA: Diagnosis present

## 2018-04-18 LAB — CBC
HCT: 38.4 % — ABNORMAL LOW (ref 39.0–52.0)
Hemoglobin: 12.9 g/dL — ABNORMAL LOW (ref 13.0–17.0)
MCH: 30.2 pg (ref 26.0–34.0)
MCHC: 33.6 g/dL (ref 30.0–36.0)
MCV: 89.9 fL (ref 80.0–100.0)
Platelets: 138 10*3/uL — ABNORMAL LOW (ref 150–400)
RBC: 4.27 MIL/uL (ref 4.22–5.81)
RDW: 14.1 % (ref 11.5–15.5)
WBC: 6.2 10*3/uL (ref 4.0–10.5)
nRBC: 0 % (ref 0.0–0.2)

## 2018-04-18 LAB — COMPREHENSIVE METABOLIC PANEL
ALT: 70 U/L — ABNORMAL HIGH (ref 0–44)
AST: 95 U/L — ABNORMAL HIGH (ref 15–41)
Albumin: 3.4 g/dL — ABNORMAL LOW (ref 3.5–5.0)
Alkaline Phosphatase: 56 U/L (ref 38–126)
Anion gap: 13 (ref 5–15)
BUN: 28 mg/dL — ABNORMAL HIGH (ref 6–20)
CO2: 22 mmol/L (ref 22–32)
Calcium: 8.1 mg/dL — ABNORMAL LOW (ref 8.9–10.3)
Chloride: 94 mmol/L — ABNORMAL LOW (ref 98–111)
Creatinine, Ser: 1.82 mg/dL — ABNORMAL HIGH (ref 0.61–1.24)
GFR calc Af Amer: 47 mL/min — ABNORMAL LOW (ref >60)
GFR calc non Af Amer: 40 mL/min — ABNORMAL LOW (ref >60)
Glucose, Bld: 83 mg/dL (ref 70–99)
Potassium: 3.4 mmol/L — ABNORMAL LOW (ref 3.5–5.1)
Sodium: 129 mmol/L — ABNORMAL LOW (ref 135–145)
Total Bilirubin: 1.2 mg/dL (ref 0.3–1.2)
Total Protein: 7.7 g/dL (ref 6.5–8.1)

## 2018-04-18 LAB — PROTIME-INR
INR: 1.1
Prothrombin Time: 14.1 seconds (ref 11.4–15.2)

## 2018-04-18 LAB — APTT: aPTT: 26 seconds (ref 24–36)

## 2018-04-18 LAB — MAGNESIUM: Magnesium: 1.4 mg/dL — ABNORMAL LOW (ref 1.7–2.4)

## 2018-04-18 LAB — HIV ANTIBODY (ROUTINE TESTING W REFLEX): HIV Screen 4th Generation wRfx: NONREACTIVE

## 2018-04-18 MED ORDER — TRAZODONE HCL 50 MG PO TABS: 50.0000 mg | ORAL_TABLET | Freq: Every evening | ORAL | Status: DC | PRN

## 2018-04-18 MED ORDER — SERTRALINE HCL 50 MG PO TABS
50.0000 mg | ORAL_TABLET | Freq: Every day | ORAL | Status: DC
  Administered 2018-04-18: 50 mg via ORAL
  Filled 2018-04-18 (×2): qty 1

## 2018-04-18 MED ORDER — ENOXAPARIN SODIUM 60 MG/0.6ML ~~LOC~~ SOLN
60.0000 mg | SUBCUTANEOUS | Status: DC
  Administered 2018-04-19 – 2018-04-20 (×2): 60 mg via SUBCUTANEOUS
  Filled 2018-04-18 (×2): qty 0.6

## 2018-04-18 MED ORDER — MAGNESIUM SULFATE 2 GM/50ML IV SOLN
2.0000 g | Freq: Once | INTRAVENOUS | Status: AC
  Administered 2018-04-19: 2 g via INTRAVENOUS
  Filled 2018-04-18: qty 50

## 2018-04-18 MED ORDER — LORAZEPAM 2 MG/ML IJ SOLN
1.0000 mg | Freq: Four times a day (QID) | INTRAMUSCULAR | Status: DC | PRN
  Administered 2018-04-18: 1 mg via INTRAVENOUS

## 2018-04-18 MED ORDER — SODIUM CHLORIDE 0.9 % IV SOLN
INTRAVENOUS | Status: DC
  Administered 2018-04-18: 13:00:00 via INTRAVENOUS

## 2018-04-18 MED ORDER — VITAMIN B-1 100 MG PO TABS: 100.0000 mg | ORAL_TABLET | Freq: Every day | ORAL | Status: DC

## 2018-04-18 MED ORDER — LORAZEPAM 2 MG/ML IJ SOLN
INTRAMUSCULAR | Status: AC
  Filled 2018-04-18: qty 1

## 2018-04-18 MED ORDER — FOLIC ACID 1 MG PO TABS
1.0000 mg | ORAL_TABLET | Freq: Every day | ORAL | Status: DC
  Administered 2018-04-18: 1 mg via ORAL
  Filled 2018-04-18: qty 1

## 2018-04-18 MED ORDER — SODIUM CHLORIDE 0.9 % IV BOLUS
1000.0000 mL | Freq: Once | INTRAVENOUS | Status: AC
  Administered 2018-04-18: 1000 mL via INTRAVENOUS

## 2018-04-18 MED ORDER — ASPIRIN EC 81 MG PO TBEC
81.0000 mg | DELAYED_RELEASE_TABLET | Freq: Every day | ORAL | Status: DC
  Administered 2018-04-19 – 2018-04-21 (×3): 81 mg via ORAL
  Filled 2018-04-18 (×3): qty 1

## 2018-04-18 MED ORDER — PRAVASTATIN SODIUM 20 MG PO TABS
20.0000 mg | ORAL_TABLET | Freq: Every day | ORAL | Status: DC
  Administered 2018-04-19 – 2018-04-21 (×3): 20 mg via ORAL
  Filled 2018-04-18 (×3): qty 1

## 2018-04-18 MED ORDER — METOPROLOL TARTRATE 25 MG PO TABS: 25.0000 mg | ORAL_TABLET | Freq: Two times a day (BID) | ORAL | Status: DC

## 2018-04-18 MED ORDER — LORAZEPAM 2 MG/ML IJ SOLN: 1.0000 mg | INTRAMUSCULAR | Status: DC | PRN

## 2018-04-18 MED ORDER — HYDRALAZINE HCL 20 MG/ML IJ SOLN: 5.0000 mg | INTRAMUSCULAR | Status: DC | PRN

## 2018-04-18 MED ORDER — SERTRALINE HCL 50 MG PO TABS
50.0000 mg | ORAL_TABLET | Freq: Every day | ORAL | Status: DC
  Administered 2018-04-19 – 2018-04-21 (×3): 50 mg via ORAL
  Filled 2018-04-18 (×3): qty 1

## 2018-04-18 MED ORDER — ONDANSETRON HCL 4 MG/2ML IJ SOLN: 4.0000 mg | Freq: Four times a day (QID) | INTRAMUSCULAR | Status: DC | PRN

## 2018-04-18 MED ORDER — DEXMEDETOMIDINE HCL IN NACL 200 MCG/50ML IV SOLN
0.2000 ug/kg/h | INTRAVENOUS | Status: DC
  Administered 2018-04-18 – 2018-04-19 (×2): 0.2 ug/kg/h via INTRAVENOUS
  Administered 2018-04-19: 0.3 ug/kg/h via INTRAVENOUS
  Administered 2018-04-19 (×2): 0.2 ug/kg/h via INTRAVENOUS
  Filled 2018-04-18 (×3): qty 50

## 2018-04-18 MED ORDER — LORAZEPAM 2 MG/ML IJ SOLN
2.0000 mg | Freq: Once | INTRAMUSCULAR | Status: AC
  Administered 2018-04-18: 2 mg via INTRAMUSCULAR

## 2018-04-18 MED ORDER — THIAMINE HCL 100 MG/ML IJ SOLN
250.0000 mg | Freq: Every day | INTRAVENOUS | Status: DC
  Administered 2018-04-18: 250 mg via INTRAVENOUS
  Filled 2018-04-18: qty 2.5

## 2018-04-18 MED ORDER — GABAPENTIN 300 MG PO CAPS: 300.0000 mg | ORAL_CAPSULE | Freq: Two times a day (BID) | ORAL | Status: DC

## 2018-04-18 MED ORDER — HEPARIN SODIUM (PORCINE) 5000 UNIT/ML IJ SOLN
5000.0000 [IU] | Freq: Three times a day (TID) | INTRAMUSCULAR | Status: DC
  Administered 2018-04-18 (×2): 5000 [IU] via SUBCUTANEOUS
  Filled 2018-04-18 (×2): qty 1

## 2018-04-18 MED ORDER — ONDANSETRON HCL 4 MG PO TABS: 4.0000 mg | ORAL_TABLET | Freq: Four times a day (QID) | ORAL | Status: DC | PRN

## 2018-04-18 MED ORDER — HALOPERIDOL LACTATE 5 MG/ML IJ SOLN
5.0000 mg | Freq: Once | INTRAMUSCULAR | Status: AC
  Administered 2018-04-18: 5 mg via INTRAMUSCULAR
  Filled 2018-04-18: qty 1

## 2018-04-18 MED ORDER — MAGNESIUM SULFATE 2 GM/50ML IV SOLN
2.0000 g | Freq: Once | INTRAVENOUS | Status: AC
  Administered 2018-04-18: 2 g via INTRAVENOUS
  Filled 2018-04-18: qty 50

## 2018-04-18 MED ORDER — ADULT MULTIVITAMIN W/MINERALS CH
1.0000 | ORAL_TABLET | Freq: Every day | ORAL | Status: DC
  Administered 2018-04-18: 1 via ORAL
  Filled 2018-04-18: qty 1

## 2018-04-18 MED ORDER — THIAMINE HCL 100 MG/ML IJ SOLN
Freq: Once | INTRAVENOUS | Status: AC
  Administered 2018-04-19: 01:00:00 via INTRAVENOUS
  Filled 2018-04-18: qty 1000

## 2018-04-18 MED ORDER — ASPIRIN EC 81 MG PO TBEC
81.0000 mg | DELAYED_RELEASE_TABLET | Freq: Every day | ORAL | Status: DC
  Administered 2018-04-18: 81 mg via ORAL
  Filled 2018-04-18 (×2): qty 1

## 2018-04-18 MED ORDER — POTASSIUM CHLORIDE CRYS ER 20 MEQ PO TBCR: 20.0000 meq | EXTENDED_RELEASE_TABLET | Freq: Once | ORAL | Status: DC

## 2018-04-18 MED ORDER — ACETAMINOPHEN 650 MG RE SUPP: 650.0000 mg | Freq: Four times a day (QID) | RECTAL | Status: DC | PRN

## 2018-04-18 MED ORDER — ACETAMINOPHEN 325 MG PO TABS: 650.0000 mg | ORAL_TABLET | Freq: Four times a day (QID) | ORAL | Status: DC | PRN

## 2018-04-18 MED ORDER — LORAZEPAM 2 MG/ML IJ SOLN
1.0000 mg | Freq: Once | INTRAMUSCULAR | Status: AC
  Administered 2018-04-18: 1 mg via INTRAVENOUS
  Filled 2018-04-18: qty 1

## 2018-04-18 MED ORDER — HYDROXYZINE HCL 25 MG PO TABS: 25.0000 mg | ORAL_TABLET | Freq: Four times a day (QID) | ORAL | Status: DC | PRN

## 2018-04-18 MED ORDER — LORAZEPAM 1 MG PO TABS
1.0000 mg | ORAL_TABLET | Freq: Four times a day (QID) | ORAL | Status: DC | PRN
  Administered 2018-04-18: 1 mg via ORAL
  Filled 2018-04-18: qty 1

## 2018-04-18 MED ORDER — VITAMIN B-1 100 MG PO TABS: 250.0000 mg | ORAL_TABLET | Freq: Every day | ORAL | Status: DC

## 2018-04-18 NOTE — H&P (Signed)
History and Physical    Benjamin PedroMarcel Cannaday ZOX:[id number] DOB: 12-13-1960 DOA: 04/18/2018  PCP: Medicine, [facility] (Inactive)  Patient coming from: Home  I have personally briefly reviewed patient's [facility]  Chief Complaint: AMS, DTs  HPI: Benjamin Richard is a 58 y.o. male with medical history significant of EtOH abuse, last drink reportedly 04/08/2018.  [facility] h/o falls, R foot drop, EtOH withdrawal.  While there he had increasing confusion, AMS, tremor.  He was apparently able to elope.  Rec[place] [employer] stumbling [place] [address] and [facility]   ED Course: In the ED he was aggressive with staff as well has having documented visual and auditory hallucinations (MC kidnapped me, people were chasing me with G[employer] earlier today, talking to someone who isnt there saying "we'll leave after the game", etc).  After 4 of ativan and 5 of haldol his CIWA is still a 31.  He was still trying to leave the ED AMA; however, 2 EDPs felt (probably correctly) that this wasn't safe, and so patient has been IVCd and put in 4 point leather restraints to the bed.   Review of Systems: As per HPI otherwise 10 point review of systems negative.  He is pretty angry and threatening to sue us all at this time.  Past Medical History:  Diagnosis Date  . GERD (gastroesophageal reflux disease)   . Hypertension     Past Surgical History:  Procedure Laterality Date  . open heart surgery  2010     reports that he has never smoked. He has never used smokeless tobacco. He reports previous alcohol use. He reports that he does not use drugs.  No Known Allergies  History reviewed. No pertinent family history.   Prior to Admission medications   Medication Sig Start Date End Date Taking? Authorizing Provider  aspirin EC 81 MG tablet Take 81 mg by mouth daily.    [provider]  gabapentin  (NEURONTIN) 300 MG capsule Take 300 mg by mouth 2 (two) times daily.     [provider]  hydrOXYzine (ATARAX/VISTARIL) 25 MG tablet Take 1 tablet (25 mg total) by mouth every 6 (six) hours as needed (anxiety/agitatio). Patient taking differently: Take 25 mg by mouth every 6 (six) hours as needed for anxiety (agitation).  03/28/17   Money, Gerlene Burdockravis B, FNP  ibuprofen (ADVIL,MOTRIN) 200 MG tablet Take 400 mg by mouth every 6 (six) hours as needed for mild pain.    [provider]  lisinopril-hydrochlorothiazide (PRINZIDE,ZESTORETIC) 20-25 MG tablet Take 0.5 tablets by mouth 2 (two) times daily.     [provider]  metoprolol tartrate (LOPRESSOR) 25 MG tablet Take 25 mg by mouth 2 (two) times daily.    [provider]  pravastatin (PRAVACHOL) 20 MG tablet Take 20 mg by mouth daily.    [provider]  sertraline (ZOLOFT) 50 MG tablet Take 1 tablet (50 mg total) by mouth daily. For mood control 03/29/17   Money, Gerlene Burdockravis B, FNP  traZODone (DESYREL) 50 MG tablet Take 50 mg by mouth at bedtime as needed for sleep.    [provider]    Physical Exam: Vitals:   04/18/18 2230 04/18/18 2300 04/18/18 2300 04/18/18 2307  BP: (!) 145/99 (!) 157/102 (!) 157/102 (!) 157/102  Pulse:  (!) 104  (!) 103  Resp: (!) 27 17    SpO2:  96%  Constitutional: Agitated Eyes: PERRL, lids and conjunctivae normal ENMT: Mucous membranes are moist. Posterior pharynx clear of any exudate or lesions.Normal dentition.  Neck: normal, supple, no masses, no thyromegaly Respiratory: clear to auscultation bilaterally, no wheezing, no crackles. Normal respiratory effort. No accessory muscle use.  Cardiovascular: Regular rate and rhythm, no murmurs / rubs / gallops. No extremity edema. 2+ pedal pulses. No carotid bruits.  Abdomen: no tenderness, no masses palpated. No hepatosplenomegaly. Bowel sounds positive.  Musculoskeletal: no clubbing / cyanosis. No joint deformity upper  and lower extremities. Good ROM, no contractures. Normal muscle tone.  Skin: no rashes, lesions, ulcers. No induration Neurologic: CN 2-12 grossly intact. Sensation intact, DTR normal. Strength 5/5 in all 4.  Psychiatric: Lacks judgement and insight.  Oriented to person, place, date.  But was also having hallucinations.   Labs on Admission: I have personally reviewed following labs and imaging studies  CBC: Recent Labs  Lab 04/17/18 1845 04/18/18 0436  WBC 6.4 6.2  HGB 13.5 12.9*  HCT 38.5* 38.4*  MCV 89.1 89.9  PLT 139* 138*   Basic Metabolic Panel: Recent Labs  Lab 04/17/18 1845 04/18/18 0436  NA 126* 129*  K 3.2* 3.4*  CL 89* 94*  CO2 23 22  GLUCOSE 108* 83  BUN 28* 28*  CREATININE 2.12* 1.82*  CALCIUM 8.3* 8.1*  MG  --  1.4*   GFR: Estimated Creatinine Clearance: 64.3 mL/min (A) (by C-G formula based on SCr of 1.82 mg/dL (H)). Liver Function Tests: Recent Labs  Lab 04/17/18 1845 04/18/18 0436  AST 106* 95*  ALT 75* 70*  ALKPHOS 58 56  BILITOT 1.2 1.2  PROT 8.1 7.7  ALBUMIN 3.6 3.4*   No results for input(s): LIPASE, AMYLASE in the last 168 hours. No results for input(s): AMMONIA in the last 168 hours. Coagulation Profile: Recent Labs  Lab 04/18/18 0436  INR 1.10   Cardiac Enzymes: No results for input(s): CKTOTAL, CKMB, CKMBINDEX, TROPONINI in the last 168 hours. BNP (last 3 results) No results for input(s): PROBNP in the last 8760 hours. HbA1C: No results for input(s): HGBA1C in the last 72 hours. CBG: Recent Labs  Lab 04/17/18 1845  GLUCAP 102*   Lipid Profile: No results for input(s): CHOL, HDL, LDLCALC, TRIG, CHOLHDL, LDLDIRECT in the last 72 hours. Thyroid Function Tests: No results for input(s): TSH, T4TOTAL, FREET4, T3FREE, THYROIDAB in the last 72 hours. Anemia Panel: No results for input(s): VITAMINB12, FOLATE, FERRITIN, TIBC, IRON, RETICCTPCT in the last 72 hours. Urine analysis:    Component Value Date/Time   COLORURINE  YELLOW 04/17/2018 2102   APPEARANCEUR HAZY (A) 04/17/2018 2102   LABSPEC 1.019 04/17/2018 2102   PHURINE 6.0 04/17/2018 2102   GLUCOSEU NEGATIVE 04/17/2018 2102   HGBUR SMALL (A) 04/17/2018 2102   BILIRUBINUR NEGATIVE 04/17/2018 2102   KETONESUR 5 (A) 04/17/2018 2102   PROTEINUR 100 (A) 04/17/2018 2102   NITRITE NEGATIVE 04/17/2018 2102   LEUKOCYTESUR NEGATIVE 04/17/2018 2102    Radiological Exams on Admission: Ct Head Wo Contrast  Result Date: 04/17/2018 CLINICAL DATA:  Multiple syncopal episodes yesterday, generalized weakness, weakness in LEFT arm and RIGHT leg, history hypertension EXAM: CT HEAD WITHOUT CONTRAST CT CERVICAL SPINE WITHOUT CONTRAST TECHNIQUE: Multidetector CT imaging of the head and cervical spine was performed following the standard protocol without intravenous contrast. Multiplanar CT image reconstructions of the cervical spine were also generated. COMPARISON:  None FINDINGS: CT HEAD FINDINGS Brain: Mild generalized chest he atrophy. Normal ventricular morphology. No midline shift or  mass effect. Otherwise normal appearance of brain parenchyma. No intracranial hemorrhage, mass lesion or evidence of acute infarction. No extra-axial fluid collections. Vascular: Minimal atherosclerotic calcification of internal carotid arteries at skull base. No hyperdense vessels. Skull: Intact Sinuses/Orbits: Clear Other: Mild nasal septal deviation to the RIGHT. CT CERVICAL SPINE FINDINGS Alignment: Normal Skull base and vertebrae: Visualized skull base intact. Osseous mineralization grossly normal. Disc space narrowing with endplate spur formation at C5-C6. Vertebral body and disc space heights otherwise maintained. No fracture or subluxation. Mild scattered facet degenerative changes. Uncovertebral spurs encroach upon the C5-C6 neural foramina bilaterally. Soft tissues and spinal canal: Prevertebral soft tissues normal thickness. Atherosclerotic calcifications at the LEFT carotid bifurcation.  Disc levels:  Mildly bulging C5-C6 disc. Upper chest: Tips of lung apices clear Other: N/A IMPRESSION: No acute intracranial abnormalities. Degenerative disc disease changes at C5-C6. No acute cervical spine abnormalities. Electronically Signed   By: Ulyses Southward M.D.   On: 04/17/2018 20:06   Ct Cervical Spine Wo Contrast  Result Date: 04/17/2018 CLINICAL DATA:  Multiple syncopal episodes yesterday, generalized weakness, weakness in LEFT arm and RIGHT leg, history hypertension EXAM: CT HEAD WITHOUT CONTRAST CT CERVICAL SPINE WITHOUT CONTRAST TECHNIQUE: Multidetector CT imaging of the head and cervical spine was performed following the standard protocol without intravenous contrast. Multiplanar CT image reconstructions of the cervical spine were also generated. COMPARISON:  None FINDINGS: CT HEAD FINDINGS Brain: Mild generalized chest he atrophy. Normal ventricular morphology. No midline shift or mass effect. Otherwise normal appearance of brain parenchyma. No intracranial hemorrhage, mass lesion or evidence of acute infarction. No extra-axial fluid collections. Vascular: Minimal atherosclerotic calcification of internal carotid arteries at skull base. No hyperdense vessels. Skull: Intact Sinuses/Orbits: Clear Other: Mild nasal septal deviation to the RIGHT. CT CERVICAL SPINE FINDINGS Alignment: Normal Skull base and vertebrae: Visualized skull base intact. Osseous mineralization grossly normal. Disc space narrowing with endplate spur formation at C5-C6. Vertebral body and disc space heights otherwise maintained. No fracture or subluxation. Mild scattered facet degenerative changes. Uncovertebral spurs encroach upon the C5-C6 neural foramina bilaterally. Soft tissues and spinal canal: Prevertebral soft tissues normal thickness. Atherosclerotic calcifications at the LEFT carotid bifurcation. Disc levels:  Mildly bulging C5-C6 disc. Upper chest: Tips of lung apices clear Other: N/A IMPRESSION: No acute intracranial  abnormalities. Degenerative disc disease changes at C5-C6. No acute cervical spine abnormalities. Electronically Signed   By: Ulyses Southward M.D.   On: 04/17/2018 20:06    EKG: Independently reviewed.  Assessment/Plan Principal Problem:   Delirium tremens (HCC) Active Problems:   Hyponatremia   AKI (acute kidney injury) (HCC)   Hypokalemia   Dehydration   Alcohol withdrawal (HCC)    1. DTs - 1. IVCd at this time by EDP 2. ICU withdrawal protocol 3. Ativan PRN 4. Start precedex 1. EDP consulting PCCM as we are required to do when starting precedex 5. Tele monitor 6. Given foot drop and resident concern for CNS issues, will order MRI brain 2. AKI - 1. IVF 2. Repeat BMP in AM 3. Holding lisinopril-hctz 3. Hyponatremia - 1. Improving with IVF 2. Repeat BMP in AM 4. Hypokalemia - 1. Replace 2. Also replacing Mg 5. HTN - 1. Holding lisinopril-HCTZ 2. Holding metoprolol, resume if still hypertensive despite precedex  DVT prophylaxis: Lovenox Code Status: Full Family Communication: No family in room Disposition Plan: TBD Consults called: PCCM - for precedex Admission status: Admit to inpatient  Severity of Illness: The appropriate patient status for this patient is INPATIENT. Inpatient  status is judged to be reasonable and necessary in order to provide the required intensity of service to ensure the patient's safety. The patient's presenting symptoms, physical exam findings, and initial radiographic and laboratory data in the context of their chronic comorbidities is felt to place them at high risk for further clinical deterioration. Furthermore, it is not anticipated that the patient will be medically stable for discharge from the hospital within 2 midnights of admission. The following factors support the patient status of inpatient.   " The patient's presenting symptoms include halucinations, acute encephalopathy, frank delirium tremens. " The worrisome physical exam findings  include tremors, halucinations, tachycardia. " The initial radiographic and laboratory data are worrisome because of AKI. " The chronic co-morbidities include EtOH abuse, HTN.   * I certify that at the point of admission it is my clinical judgment that the patient will require inpatient hospital care spanning beyond 2 midnights from the point of admission due to high intensity of service, high risk for further deterioration and high frequency of surveillance required.Hillary Bow DO [facility] Pager [phone] Only works nights!  If 7AM-7PM, please contact the primary day team physician taking care of patient  [email or web address] Password [facility]  04/18/2018, 11:38 PM

## 2018-04-18 NOTE — ED Notes (Signed)
Patient continuously refusing MRI. MD aware.

## 2018-04-18 NOTE — ED Notes (Signed)
Patient transferred to Res B for closer monitoring

## 2018-04-18 NOTE — ED Notes (Addendum)
Patient pulled out IV. This RN paused fluids at this time and will restart fluids once IV access has been reestablished.

## 2018-04-18 NOTE — ED Notes (Signed)
Dinner Tray Ordered @ 1720-per Patty, RN-called by Benjamin Richard  

## 2018-04-18 NOTE — Progress Notes (Signed)
Would HIGHLY recommend MRI and admission for acute mental status change once patient reaches [facility].  Concerns include normal pressure hydrocephalus, occult bleed, CVA, metabolic encephalopathy.   See previous notes, patient absconded from [facility] shortly after reaching his room despite valiant efforts from several nurses (including attempting to follow him to the exit and talk him into staying). I called [facility] Police to perform a welfare check as we were concerned he was trying to drive home while acutely altered (reportedly normal mental status this afternoon from day team). [employer] was able to find him looking for a ride near [facility], and while the patient was unwilling to return to [place], [employer] officer was able to convince the patient to be transported to [facility] ED. [employer] officer called me back to provide this update after sending the patient to [facility]. Would highly recommend admission and consideration of IVC if necessary.   Loni Muse, MD PGY 3 FM

## 2018-04-18 NOTE — ED Notes (Signed)
Called to patient's room-patient tearing off EKG leads,aggressive to staff-patient is confused to time and place-security assist to place patient back to bed after patient striking at staff and trying to leave. Patient restrained with gurney cuffs for patient and staff safety. Patient talking to Joel-having hallucinations-no Francis DowseJoel in room-patient states to Joel"we'll leave after the game. Unable to orient patient to his environment and that he was hospitalized at [facility]-administered Haldol 5 mg IM and Ativan 2 mg IM-patient had sitter in room and tried to reorient patient-patient aggressive with this Clinical research associatewriter and DJ EMT. Dr. Rush Landmarkegeler in to re-evaluate patient-IV's removed by patient and IV restarted by Lillia AbedLindsay RN-patient placed back on monitor and continuous pulse ox and NIBP cuff.

## 2018-04-18 NOTE — ED Notes (Signed)
Breakfast tray ordered 

## 2018-04-18 NOTE — ED Notes (Addendum)
Repositioned in bed for comfort-patient talking to himself-extreme tremors to extremities
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
## 2018-04-18 NOTE — ED Notes (Signed)
PT WAS GIVEN 2 ATIVAN IM  PT IVC AND RESTRAINT... PT meds were pulled under wrong pt name in emergency situation.   Information charted in recorded per pharmacy request and ED management  Witnessed by JBF and JO RN

## 2018-04-18 NOTE — ED Notes (Signed)
Patient remains acutely agitated and diaphoretic-active visual hallucinations-states "don't you see all those people, all those girls"-explained to patient there was no one else in the room but this writer-patient trying to pull EKG leads off-patient states "Are you going to feed me beer all night"-attempted to reorient patient to ED room and environment.

## 2018-04-18 NOTE — Discharge Summary (Signed)
Family Medicine Teaching [facility] Discharge Summary  Patient name: Benjamin Richard Medical record number: [id number] Date of birth: Jun 05, 1960 Age: 58 y.o. Gender: male Date of Admission: 04/17/2018  Date of Discharge: 04/18/2018 Admitting Physician: Leighton Roach McDiarmid, MD  Primary Care Provider: Medicine, [facility] (Inactive) Consultants: none  Indication for Hospitalization: alcohol withdrawal, hyponatremia, AKI  Discharge Diagnoses/Problem List:  Patient left AMA Acute alcohol withdrawal with delirium HTN HLD AKI Hyponatremia Hypokalemia   Disposition: left AMA  Discharge Condition: acute delirium likely 2/2 alcohol withdrawal and/or metabolic encephalopathy   Discharge Exam:  Per nurse who witnessed him leaving- he was very unsteady on his feet and was not oriented to place.   Brief Hospital Course:  Patient was admitted 04/17/2018 for treatment of alcohol withdrawal. He was symptomatic with a tremor, hallucinations, unsteady gate, but was otherwise oriented and reasonable on admission with stable vital signs. Head/neck CT since patient had 4 falls was negative for acute changes. Initial CIWA score of 7. He was also found to have several electrolyte abnormalities, dehydration, and and AKI. He endorsed last drink on 04/08/2018. He was given IV fluids, folic acid, magnesium, potassium, thiamine, ativan x2 while admitted. He was admitted to our service on a step down unit but was in the ED overnight awaiting a bed on the appropriate unit. He had an order for head MRI to r/o other pathologic causes of his symptoms and AMS status but patient refused 2/2 financial concerns. Shortly after he was transferred to his bed, nurse paged with concern for acute worsening of patient's mental status. He had increased confusion, was not oriented to place, was expressing paranoid statements against staff and was verbally and physically aggressive. He stated that he was going to leave  AMA and had packed his belongings and left the premises. He had removed his IV and left campus- was looking for a ride home on [address] when he was picked up by local police department which had been notified by our team for welfare check. Police informed us that patient requested to be brought to [facility].  Last vitals recorded prior to leaving AMA:  HR 84, RR 18, BP 144/101, O2 97% ORA  CIWA scores on day of leaving AMA: 11, 8, received 2 doses ativan per protocol  Issues for Follow Up:  1. Continue to monitor alcohol withdrawal symptoms 2. Recommend head MRI 3. Continue to monitor and correct for electrolyte imbalances and AKI  Significant Procedures: none  Significant Labs and Imaging:  Recent Labs  Lab 04/17/18 1845 04/18/18 0436  WBC 6.4 6.2  HGB 13.5 12.9*  HCT 38.5* 38.4*  PLT 139* 138*   Recent Labs  Lab 04/17/18 1845 04/18/18 0436  NA 126* 129*  K 3.2* 3.4*  CL 89* 94*  CO2 23 22  GLUCOSE 108* 83  BUN 28* 28*  CREATININE 2.12* 1.82*  CALCIUM 8.3* 8.1*  MG  --  1.4*  ALKPHOS 58 56  AST 106* 95*  ALT 75* 70*  ALBUMIN 3.6 3.4*    Ct Head Wo Contrast  Result Date: 04/17/2018 CLINICAL DATA:  Multiple syncopal episodes yesterday, generalized weakness, weakness in LEFT arm and RIGHT leg, history hypertension EXAM: CT HEAD WITHOUT CONTRAST CT CERVICAL SPINE WITHOUT CONTRAST TECHNIQUE: Multidetector CT imaging of the head and cervical spine was performed following the standard protocol without intravenous contrast. Multiplanar CT image reconstructions of the cervical spine were also generated. COMPARISON:  None FINDINGS: CT HEAD FINDINGS Brain: Mild generalized chest he  atrophy. Normal ventricular morphology. No midline shift or mass effect. Otherwise normal appearance of brain parenchyma. No intracranial hemorrhage, mass lesion or evidence of acute infarction. No extra-axial fluid collections. Vascular: Minimal atherosclerotic calcification of internal carotid arteries at  skull base. No hyperdense vessels. Skull: Intact Sinuses/Orbits: Clear Other: Mild nasal septal deviation to the RIGHT. CT CERVICAL SPINE FINDINGS Alignment: Normal Skull base and vertebrae: Visualized skull base intact. Osseous mineralization grossly normal. Disc space narrowing with endplate spur formation at C5-C6. Vertebral body and disc space heights otherwise maintained. No fracture or subluxation. Mild scattered facet degenerative changes. Uncovertebral spurs encroach upon the C5-C6 neural foramina bilaterally. Soft tissues and spinal canal: Prevertebral soft tissues normal thickness. Atherosclerotic calcifications at the LEFT carotid bifurcation. Disc levels:  Mildly bulging C5-C6 disc. Upper chest: Tips of lung apices clear Other: N/A IMPRESSION: No acute intracranial abnormalities. Degenerative disc disease changes at C5-C6. No acute cervical spine abnormalities. Electronically Signed   By: Ulyses SouthwardMark  Boles M.D.   On: 04/17/2018 20:06   Ct Cervical Spine Wo Contrast  Result Date: 04/17/2018 CLINICAL DATA:  Multiple syncopal episodes yesterday, generalized weakness, weakness in LEFT arm and RIGHT leg, history hypertension EXAM: CT HEAD WITHOUT CONTRAST CT CERVICAL SPINE WITHOUT CONTRAST TECHNIQUE: Multidetector CT imaging of the head and cervical spine was performed following the standard protocol without intravenous contrast. Multiplanar CT image reconstructions of the cervical spine were also generated. COMPARISON:  None FINDINGS: CT HEAD FINDINGS Brain: Mild generalized chest he atrophy. Normal ventricular morphology. No midline shift or mass effect. Otherwise normal appearance of brain parenchyma. No intracranial hemorrhage, mass lesion or evidence of acute infarction. No extra-axial fluid collections. Vascular: Minimal atherosclerotic calcification of internal carotid arteries at skull base. No hyperdense vessels. Skull: Intact Sinuses/Orbits: Clear Other: Mild nasal septal deviation to the RIGHT. CT  CERVICAL SPINE FINDINGS Alignment: Normal Skull base and vertebrae: Visualized skull base intact. Osseous mineralization grossly normal. Disc space narrowing with endplate spur formation at C5-C6. Vertebral body and disc space heights otherwise maintained. No fracture or subluxation. Mild scattered facet degenerative changes. Uncovertebral spurs encroach upon the C5-C6 neural foramina bilaterally. Soft tissues and spinal canal: Prevertebral soft tissues normal thickness. Atherosclerotic calcifications at the LEFT carotid bifurcation. Disc levels:  Mildly bulging C5-C6 disc. Upper chest: Tips of lung apices clear Other: N/A IMPRESSION: No acute intracranial abnormalities. Degenerative disc disease changes at C5-C6. No acute cervical spine abnormalities. Electronically Signed   By: Ulyses SouthwardMark  Boles M.D.   On: 04/17/2018 20:06    Results/Tests Pending at Time of Discharge:  Treatment incomplete 2/2 patient leaving AMA.  Discharge Medications:   No medications were prescribed at discharge.  Discharge Instructions:  Patient was instructed to stay at the hospital for treatment  Follow-Up Appointments: Patient requested local authorities [facility].  Leeroy BockAnderson, Debria Broecker L, DO 04/18/2018, 8:07 PM PGY-1, [facility]

## 2018-04-18 NOTE — ED Triage Notes (Signed)
Pt comes to [facility], per AMA at [facility], ran and police acquired. Refused to go back to [facility]. Assessment in progress. Sweating , red, shaking per meds given pt verbalizes

## 2018-04-18 NOTE — ED Notes (Signed)
Pt became increasingly agitated at the fact that he was in the hospital under IVC orders. EMT explained to pt that he was here for his own safety so that medical staff would be able to take care of him. EMT also explained to pt that he was brought in EMS. Pt tried to rip out IV lines and bandage dressings. Pt then made an aggressive motion towards this EMT's charge nurse. Pt was then made to get back in the bed. PD, MD and RN at bedside.

## 2018-04-18 NOTE — ED Notes (Signed)
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

## 2018-04-18 NOTE — ED Notes (Signed)
Pt denies drinking any ETOH since December 27th-- pt is shaky, denies any nausea-- does not want his breakfast but requests a sandwich.

## 2018-04-18 NOTE — ED Notes (Signed)
Patient refusing to go to MRI. MD paged.

## 2018-04-18 NOTE — ED Notes (Signed)
Patient increasingly confused and asking questions like "who pays for all of this?" and "So is this part the hotel part and the other part where we came from the patient part?" This RN explained to the patient that the whole building is a hospital and attempted to reorient the patient to reality. 5W nurse made aware.

## 2018-04-18 NOTE — ED Notes (Signed)
Dr. Julian Reil at bedside evaluating patient-patient remains agitated, restless, patient admits to heavy drinking and states he has had severe withdrawal in the past.

## 2018-04-18 NOTE — Progress Notes (Signed)
FPTS Interim Progress Note  S: nurse paged for patient arrival to floor from ED with increased confusion and aggression, threatening to leave AMA. Called nurse back and was informed the patient was getting dressed and going to elevator to leave with belongings. He was not oriented to where he is and thought he was in a hotel and a doctor must have poisoned him.  Immediately, I ran to patient's floor to have discussion with patient. Nurses at desk caught me before I got to his room and said that he had already gotten on the elevator to leave and a nurse had gone after him. I headed to the main entrance of the hospital to try to find him and prevent him from leaving as he would be a probable danger to himself and others in his current delirious state if he were to leave the hospital and especially if he is trying to drive. I did not see him there and did not see him while I continued to search in the vicinity. Returned to the patient's floor to discuss further with nurses. I did see that he had removed his IV line and left it in his room prior to leaving. Security was contacted by nurses and they are aware of patient description and are looking out for him. Dr. Chanetta Marshall called local police department to request a welfare check if patient has left for home and request that he be brought back to the ED.  O: BP (!) 144/101   Pulse 84   Temp 98.8 F (37.1 C) (Oral)   Resp 18   Ht 6\' 5"  (1.956 m)   Wt 120.2 kg   SpO2 97%   BMI 31.42 kg/m     A/P: Patient left AMA without signing forms and self removed his IV line and identification bracelet. He was not oriented at the time and described as being very unsteady on his feet and aggressive with nurses as he left. Patient is in danger of self harm and harm of others. Chart review shows that patient was brought to ED via EMS so positive that he does not have access to a vehicle at current time.  Security aware and will search for patient in hospital campus  and in surrounding public transit locations Police department given description of patient and home address for welfare check. Readmit patient if found with IVC orders for patient safety  Leeroy Bock, DO 04/18/2018, 7:36 PM PGY-1, [facility] Service pager [phone]

## 2018-04-18 NOTE — ED Notes (Signed)
Lunch Tray Ordered @ 1229-per RN-called by Masaki Rothbauer  

## 2018-04-18 NOTE — ED Notes (Signed)
Bed: QA83 Expected date:  Expected time:  Means of arrival:  Comments: Craker,Adom from [facility]-found on side of road

## 2018-04-19 ENCOUNTER — Inpatient Hospital Stay (HOSPITAL_COMMUNITY): Payer: Self-pay

## 2018-04-19 ENCOUNTER — Encounter (HOSPITAL_COMMUNITY): Payer: Self-pay

## 2018-04-19 ENCOUNTER — Other Ambulatory Visit: Payer: Self-pay

## 2018-04-19 DIAGNOSIS — R410 Disorientation, unspecified: Secondary | ICD-10-CM

## 2018-04-19 LAB — CBC
HCT: 33.4 % — ABNORMAL LOW (ref 39.0–52.0)
Hemoglobin: 11.1 g/dL — ABNORMAL LOW (ref 13.0–17.0)
MCH: 31.1 pg (ref 26.0–34.0)
MCHC: 33.2 g/dL (ref 30.0–36.0)
MCV: 93.6 fL (ref 80.0–100.0)
Platelets: 114 10*3/uL — ABNORMAL LOW (ref 150–400)
RBC: 3.57 MIL/uL — ABNORMAL LOW (ref 4.22–5.81)
RDW: 14 % (ref 11.5–15.5)
WBC: 4.5 10*3/uL (ref 4.0–10.5)
nRBC: 0 % (ref 0.0–0.2)

## 2018-04-19 LAB — COMPREHENSIVE METABOLIC PANEL
ALT: 61 U/L — ABNORMAL HIGH (ref 0–44)
AST: 79 U/L — ABNORMAL HIGH (ref 15–41)
Albumin: 3.3 g/dL — ABNORMAL LOW (ref 3.5–5.0)
Alkaline Phosphatase: 45 U/L (ref 38–126)
Anion gap: 9 (ref 5–15)
BUN: 24 mg/dL — ABNORMAL HIGH (ref 6–20)
CO2: 23 mmol/L (ref 22–32)
Calcium: 8.2 mg/dL — ABNORMAL LOW (ref 8.9–10.3)
Chloride: 104 mmol/L (ref 98–111)
Creatinine, Ser: 1.35 mg/dL — ABNORMAL HIGH (ref 0.61–1.24)
GFR calc non Af Amer: 58 mL/min — ABNORMAL LOW (ref >60)
Glucose, Bld: 86 mg/dL (ref 70–99)
Potassium: 3.3 mmol/L — ABNORMAL LOW (ref 3.5–5.1)
SODIUM: 136 mmol/L (ref 135–145)
Total Bilirubin: 1 mg/dL (ref 0.3–1.2)
Total Protein: 7 g/dL (ref 6.5–8.1)

## 2018-04-19 LAB — MRSA PCR SCREENING: MRSA by PCR: NEGATIVE

## 2018-04-19 MED ORDER — POTASSIUM CHLORIDE CRYS ER 20 MEQ PO TBCR
40.0000 meq | EXTENDED_RELEASE_TABLET | Freq: Two times a day (BID) | ORAL | Status: AC
  Administered 2018-04-19 (×2): 40 meq via ORAL
  Filled 2018-04-19 (×2): qty 2

## 2018-04-19 MED ORDER — THIAMINE HCL 100 MG/ML IJ SOLN
100.0000 mg | Freq: Every day | INTRAMUSCULAR | Status: DC
  Administered 2018-04-19 – 2018-04-21 (×3): 100 mg via INTRAVENOUS
  Filled 2018-04-19 (×3): qty 2

## 2018-04-19 MED ORDER — POTASSIUM CHLORIDE 2 MEQ/ML IV SOLN
INTRAVENOUS | Status: DC
  Filled 2018-04-19: qty 1000

## 2018-04-19 MED ORDER — SALINE SPRAY 0.65 % NA SOLN
1.0000 | NASAL | Status: DC | PRN
  Administered 2018-04-19: 1 via NASAL
  Filled 2018-04-19: qty 44

## 2018-04-19 MED ORDER — GABAPENTIN 300 MG PO CAPS: 300.0000 mg | ORAL_CAPSULE | Freq: Two times a day (BID) | ORAL | Status: DC

## 2018-04-19 MED ORDER — FOLIC ACID 5 MG/ML IJ SOLN: 1.0000 mg | Freq: Every day | INTRAMUSCULAR | Status: DC

## 2018-04-19 MED ORDER — GABAPENTIN 300 MG PO CAPS
300.0000 mg | ORAL_CAPSULE | Freq: Two times a day (BID) | ORAL | Status: DC
  Administered 2018-04-19 – 2018-04-21 (×5): 300 mg via ORAL
  Filled 2018-04-19 (×5): qty 1

## 2018-04-19 MED ORDER — LORAZEPAM 2 MG/ML IJ SOLN: 2.0000 mg | INTRAMUSCULAR | Status: DC | PRN

## 2018-04-19 MED ORDER — KCL IN DEXTROSE-NACL 10-5-0.45 MEQ/L-%-% IV SOLN
INTRAVENOUS | Status: DC
  Administered 2018-04-19 (×3): via INTRAVENOUS
  Filled 2018-04-19 (×5): qty 1000

## 2018-04-19 MED ORDER — SODIUM CHLORIDE 0.9 % IV BOLUS
1000.0000 mL | Freq: Once | INTRAVENOUS | Status: AC
  Administered 2018-04-19: 1000 mL via INTRAVENOUS

## 2018-04-19 MED ORDER — METOPROLOL TARTRATE 25 MG PO TABS
25.0000 mg | ORAL_TABLET | Freq: Two times a day (BID) | ORAL | Status: DC
  Administered 2018-04-19 – 2018-04-21 (×5): 25 mg via ORAL
  Filled 2018-04-19 (×5): qty 1

## 2018-04-19 MED ORDER — FOLIC ACID 1 MG PO TABS
1.0000 mg | ORAL_TABLET | Freq: Every day | ORAL | Status: DC
  Administered 2018-04-19 – 2018-04-21 (×3): 1 mg via ORAL
  Filled 2018-04-19 (×3): qty 1

## 2018-04-19 MED ORDER — GUAIFENESIN ER 600 MG PO TB12
1200.0000 mg | ORAL_TABLET | Freq: Two times a day (BID) | ORAL | Status: DC
  Administered 2018-04-19 – 2018-04-20 (×3): 1200 mg via ORAL
  Filled 2018-04-19 (×4): qty 2

## 2018-04-19 MED ORDER — PHENOL 1.4 % MT LIQD
1.0000 | OROMUCOSAL | Status: DC | PRN
  Administered 2018-04-19: 1 via OROMUCOSAL
  Filled 2018-04-19: qty 177

## 2018-04-19 NOTE — ED Notes (Signed)
ED TO INPATIENT HANDOFF REPORT  Name/Age/Gender Benjamin Richard 58 y.o. male  Code Status    Code Status Orders  (From admission, onward)         Start     Ordered   04/18/18 2322  Full code  Continuous     04/18/18 2324        Code Status History    Date Active Date Inactive Code Status Order ID Comments User Context   04/18/2018 0426 04/18/2018 2023 Full Code [id number]  Leeroy Bock, DO ED   February 10, 202018 0136 03/29/2017 0410 Full Code [id number]  Charm Rings, NP Inpatient   03/25/2017 1055 03/25/2017 2332 Full Code [id number]  Robinson, [place] N, PA-C ED      Home/SNF/Other Home  Chief Complaint Altered Mental Status  Level of Care/Admitting Diagnosis ED Disposition    ED Disposition Condition Comment   Admit  Hospital [facility] [[id number]]  Level of Care: Stepdown [14]  Admit to SDU based on following criteria: Severe physiological/psychological symptoms:  Any diagnosis requiring assessment & intervention at least every 4 hours on an ongoing basis to obtain desired patient outcomes including stability and rehabilitation  Admit to SDU based on following criteria: Other see comments  Comments: [facility]  Diagnosis: Delirium tremens [facility]) [[id number]]  Admitting Physician: Wyvonnia Dusky  Attending Physician: Hillary Bow [phone]  Estimated length of stay: past midnight tomorrow  Certification:: I certify this patient will need inpatient services for at least 2 midnights  PT Class (Do Not Modify): Inpatient [101]  PT Acc Code (Do Not Modify): Private [1]       Medical History Past Medical History:  Diagnosis Date  . GERD (gastroesophageal reflux disease)   . Hypertension     Allergies No Known Allergies  IV Location/Drains/Wounds Patient Lines/Drains/Airways Status   Active Line/Drains/Airways    Name:   Placement date:   Placement time:   Site:   Days:   Peripheral IV 04/19/18 Left Forearm   04/19/18    0006     Forearm   less than 1   Peripheral IV 04/19/18 Right Forearm   04/19/18    1037    Forearm   less than 1   External Urinary Catheter   04/19/18    0526    -   less than 1          Labs/Imaging Results for orders placed or performed during the hospital encounter of 04/18/18 (from the past 48 hour(s))  CBC     Status: Abnormal   Collection Time: 04/19/18  5:17 AM  Result Value Ref Range   WBC 4.5 4.0 - 10.5 K/uL   RBC 3.57 (L) 4.22 - 5.81 MIL/uL   Hemoglobin 11.1 (L) 13.0 - 17.0 g/dL   HCT 58.3 (L) 09.4 - 07.6 %   MCV 93.6 80.0 - 100.0 fL   MCH 31.1 26.0 - 34.0 pg   MCHC 33.2 30.0 - 36.0 g/dL   RDW 80.8 81.1 - 03.1 %   Platelets 114 (L) 150 - 400 K/uL    Comment: REPEATED TO VERIFY PLATELET COUNT CONFIRMED BY SMEAR SPECIMEN CHECKED FOR CLOTS Immature Platelet Fraction may be clinically indicated, consider ordering this additional test [id number]    nRBC 0.0 0.0 - 0.2 %    Comment: Performed at [facility], [address] [place], [place] [address]  Comprehensive metabolic panel     Status: Abnormal   Collection Time: 04/19/18  5:17 AM  Result Value Ref Range   Sodium 136 135 - 145 mmol/L   Potassium 3.3 (L) 3.5 - 5.1 mmol/L   Chloride 104 98 - 111 mmol/L   CO2 23 22 - 32 mmol/L   Glucose, Bld 86 70 - 99 mg/dL   BUN 24 (H) 6 - 20 mg/dL   Creatinine, Ser 4.091.35 (H) 0.61 - 1.24 mg/dL   Calcium 8.2 (L) 8.9 - 10.3 mg/dL   Total Protein 7.0 6.5 - 8.1 g/dL   Albumin 3.3 (L) 3.5 - 5.0 g/dL   AST 79 (H) 15 - 41 U/L   ALT 61 (H) 0 - 44 U/L   Alkaline Phosphatase 45 38 - 126 U/L   Total Bilirubin 1.0 0.3 - 1.2 mg/dL   GFR calc non Af Amer 58 (L) >60 mL/min   GFR calc Af Amer >60 >60 mL/min   Anion gap 9 5 - 15    Comment: Performed at [facility], [address] [place], [place] [id number]   Ct Head Wo Contrast  Result Date: 04/17/2018 CLINICAL DATA:  Multiple syncopal episodes yesterday, generalized weakness, weakness in LEFT arm and  RIGHT leg, history hypertension EXAM: CT HEAD WITHOUT CONTRAST CT CERVICAL SPINE WITHOUT CONTRAST TECHNIQUE: Multidetector CT imaging of the head and cervical spine was performed following the standard protocol without intravenous contrast. Multiplanar CT image reconstructions of the cervical spine were also generated. COMPARISON:  None FINDINGS: CT HEAD FINDINGS Brain: Mild generalized chest he atrophy. Normal ventricular morphology. No midline shift or mass effect. Otherwise normal appearance of brain parenchyma. No intracranial hemorrhage, mass lesion or evidence of acute infarction. No extra-axial fluid collections. Vascular: Minimal atherosclerotic calcification of internal carotid arteries at skull base. No hyperdense vessels. Skull: Intact Sinuses/Orbits: Clear Other: Mild nasal septal deviation to the RIGHT. CT CERVICAL SPINE FINDINGS Alignment: Normal Skull base and vertebrae: Visualized skull base intact. Osseous mineralization grossly normal. Disc space narrowing with endplate spur formation at C5-C6. Vertebral body and disc space heights otherwise maintained. No fracture or subluxation. Mild scattered facet degenerative changes. Uncovertebral spurs encroach upon the C5-C6 neural foramina bilaterally. Soft tissues and spinal canal: Prevertebral soft tissues normal thickness. Atherosclerotic calcifications at the LEFT carotid bifurcation. Disc levels:  Mildly bulging C5-C6 disc. Upper chest: Tips of lung apices clear Other: N/A IMPRESSION: No acute intracranial abnormalities. Degenerative disc disease changes at C5-C6. No acute cervical spine abnormalities. Electronically Signed   By: Ulyses SouthwardMark  Boles M.D.   On: 04/17/2018 20:06   Ct Cervical Spine Wo Contrast  Result Date: 04/17/2018 CLINICAL DATA:  Multiple syncopal episodes yesterday, generalized weakness, weakness in LEFT arm and RIGHT leg, history hypertension EXAM: CT HEAD WITHOUT CONTRAST CT CERVICAL SPINE WITHOUT CONTRAST TECHNIQUE: Multidetector CT  imaging of the head and cervical spine was performed following the standard protocol without intravenous contrast. Multiplanar CT image reconstructions of the cervical spine were also generated. COMPARISON:  None FINDINGS: CT HEAD FINDINGS Brain: Mild generalized chest he atrophy. Normal ventricular morphology. No midline shift or mass effect. Otherwise normal appearance of brain parenchyma. No intracranial hemorrhage, mass lesion or evidence of acute infarction. No extra-axial fluid collections. Vascular: Minimal atherosclerotic calcification of internal carotid arteries at skull base. No hyperdense vessels. Skull: Intact Sinuses/Orbits: Clear Other: Mild nasal septal deviation to the RIGHT. CT CERVICAL SPINE FINDINGS Alignment: Normal Skull base and vertebrae: Visualized skull base intact. Osseous mineralization grossly normal. Disc space narrowing with endplate spur formation at C5-C6. Vertebral body and disc space heights  otherwise maintained. No fracture or subluxation. Mild scattered facet degenerative changes. Uncovertebral spurs encroach upon the C5-C6 neural foramina bilaterally. Soft tissues and spinal canal: Prevertebral soft tissues normal thickness. Atherosclerotic calcifications at the LEFT carotid bifurcation. Disc levels:  Mildly bulging C5-C6 disc. Upper chest: Tips of lung apices clear Other: N/A IMPRESSION: No acute intracranial abnormalities. Degenerative disc disease changes at C5-C6. No acute cervical spine abnormalities. Electronically Signed   By: Ulyses Southward M.D.   On: 04/17/2018 20:06   Mr Brain Wo Contrast  Result Date: 04/19/2018 CLINICAL DATA:  Alcohol abuse. Two recent falls. Right foot drop and alcohol withdrawal. EXAM: MRI HEAD WITHOUT CONTRAST TECHNIQUE: Multiplanar, multiecho pulse sequences of the brain and surrounding structures were obtained without intravenous contrast. COMPARISON:  Head CT 04/17/2018 FINDINGS: BRAIN: There is no acute infarct, acute hemorrhage, hydrocephalus  or extra-axial collection. The midline structures are normal. No midline shift or other mass effect. There are no old infarcts. The white matter signal is normal for the patient's age. Advanced atrophy for age. Susceptibility-sensitive sequences show no chronic microhemorrhage or superficial siderosis. VASCULAR: Major intracranial arterial and venous sinus flow voids are normal. SKULL AND UPPER CERVICAL SPINE: Calvarial bone marrow signal is normal. There is no skull base mass. Visualized upper cervical spine and soft tissues are normal. SINUSES/ORBITS: No fluid levels or advanced mucosal thickening. No mastoid or middle ear effusion. The orbits are normal. IMPRESSION: Advanced parenchymal atrophy for age.  No acute abnormality. Electronically Signed   By: Deatra Robinson M.D.   On: 04/19/2018 14:19   None  Pending Labs Unresulted Labs (From admission, onward)   None      Vitals/Pain Today's Vitals   04/19/18 1422 04/19/18 1432 04/19/18 1500 04/19/18 1530  BP:  (!) 152/105 133/77 (!) 149/95  Pulse:  68 73 74  Resp: 19 19 19 19   SpO2: 97% 98% 97% 96%  PainSc:        Isolation Precautions No active isolations  Medications Medications  acetaminophen (TYLENOL) tablet 650 mg (has no administration in time range)    Or  acetaminophen (TYLENOL) suppository 650 mg (has no administration in time range)  ondansetron (ZOFRAN) tablet 4 mg (has no administration in time range)    Or  ondansetron (ZOFRAN) injection 4 mg (has no administration in time range)  enoxaparin (LOVENOX) injection 60 mg (60 mg Subcutaneous Given 04/19/18 1207)  potassium chloride SA (K-DUR,KLOR-CON) CR tablet 20 mEq (20 mEq Oral Not Given 04/19/18 0027)  dexmedetomidine (PRECEDEX) 200 MCG/50ML (4 mcg/mL) infusion (0.2 mcg/kg/hr  120.2 kg Intravenous New Bag/Given 04/19/18 1520)  LORazepam (ATIVAN) injection 1-2 mg (has no administration in time range)  pravastatin (PRAVACHOL) tablet 20 mg (20 mg Oral Given 04/19/18 1009)   sertraline (ZOLOFT) tablet 50 mg (50 mg Oral Given 04/19/18 1008)  traZODone (DESYREL) tablet 50 mg (has no administration in time range)  hydrOXYzine (ATARAX/VISTARIL) tablet 25 mg (has no administration in time range)  aspirin EC tablet 81 mg (81 mg Oral Given 04/19/18 1008)  potassium chloride SA (K-DUR,KLOR-CON) CR tablet 40 mEq (40 mEq Oral Given 04/19/18 1007)  thiamine (B-1) injection 100 mg (100 mg Intravenous Given 04/19/18 1015)  metoprolol tartrate (LOPRESSOR) tablet 25 mg (25 mg Oral Given 04/19/18 1008)  folic acid (FOLVITE) tablet 1 mg (1 mg Oral Given 04/19/18 1008)  gabapentin (NEURONTIN) capsule 300 mg (300 mg Oral Given 04/19/18 1008)  dextrose 5 % and 0.45 % NaCl with KCl 10 mEq/L infusion ( Intravenous New Bag/Given 04/19/18 1021)  sodium chloride 0.9 % bolus 1,000 mL (0 mLs Intravenous Stopped 04/18/18 2246)  LORazepam (ATIVAN) injection 1 mg (1 mg Intravenous Given 04/18/18 2107)  haloperidol lactate (HALDOL) injection 5 mg (5 mg Intramuscular Given 04/18/18 2324)  LORazepam (ATIVAN) injection 2 mg (2 mg Intramuscular Given 04/18/18 2323)  sodium chloride 0.9 % 1,000 mL with thiamine 100 mg, folic acid 1 mg, multivitamins adult 10 mL infusion ( Intravenous Stopped 04/19/18 0840)  magnesium sulfate IVPB 2 g 50 mL (0 g Intravenous Stopped 04/19/18 0115)  sodium chloride 0.9 % bolus 1,000 mL (0 mLs Intravenous Stopped 04/19/18 1127)    Mobility walks with person assist

## 2018-04-19 NOTE — ED Notes (Signed)
Pt called for bathroom assistance. This NT assisted pt with urinal.

## 2018-04-19 NOTE — ED Notes (Addendum)
Report given to Benjamin Richard, Charity fundraiser. Bed is not clean yet. I can bring patient up in 20 min per Benjamin Richard, [place].

## 2018-04-19 NOTE — ED Notes (Signed)
Patient transported to MRI 

## 2018-04-19 NOTE — Progress Notes (Signed)
[facility] PROGRESS NOTE    Progress Note  Benjamin Richard  ZOX:[id number] DOB: [birth year] DOA: 04/18/2018 PCP: Medicine, [facility] (Inactive)     Brief Narrative:   Benjamin Richard is an 58 y.o. male past medical history significant for alcohol abuse last drink on 04/08/2018 admitted to [facility] on the family practice teaching service on 04/17/2017, eloped on 04/18/2017 during this time he was being evaluated for a fall left foot drop and alcohol withdrawal brought in by the Police Department [place] in the [place] the patient has been having auditory hallucinations.  He was trying to leave the hospital AGAINST MEDICAL ADVICE he was IVCD and put in four-point restraints.  Assessment/Plan:   Delirium tremens (HCC)/Alcohol withdrawal (HCC): Admitted to the ICU started on Ativan as needed.  No seizures in the hospital. Started on a Precedex drip. The patient is IVC. Start him on thiamine and folate. Brain MRI is pending.  Acute kidney injury: In the setting of NSAID use, ACE inhibitor and hydrochlorothiazide, started on IV fluids creatinine is slowly improving.    Hypovolemic hyponatremia improving with IV fluid hydration.  Hypokalemia Replete orally and IV.  Recheck in the morning.  Essential hypertension: Hold lisinopril and hydrochlorothiazide resume metoprolol.  Transaminitis: Likely due to alcohol abuse, slowly trending down.  Normocytic anemia: We will need further work-up as an outpatient.  He cannot tell me whether he has had a colonoscopy at the age of 58.  DVT prophylaxis: lovenox Family Communication:none Disposition Plan/Barrier to D/C: unable to determine admit to ICU. Code Status:     Code Status Orders  (From admission, onward)         Start     Ordered   04/18/18 2322  Full code  Continuous     04/18/18 2324        Code Status History    Date Active Date Inactive Code Status Order ID Comments User Context   04/18/2018 0426  04/18/2018 2023 Full Code [id number]  Leeroy Bocknderson, Chelsey L, DO ED   02/14/2017 0136 03/29/2017 0410 Full Code [id number]  Charm RingsLord, Jamison Y, NP Inpatient   03/25/2017 1055 03/25/2017 2332 Full Code [id number]  Robinson, [place] N, PA-C ED        IV Access:    Peripheral IV   Procedures and diagnostic studies:   Ct Head Wo Contrast  Result Date: 04/17/2018 CLINICAL DATA:  Multiple syncopal episodes yesterday, generalized weakness, weakness in LEFT arm and RIGHT leg, history hypertension EXAM: CT HEAD WITHOUT CONTRAST CT CERVICAL SPINE WITHOUT CONTRAST TECHNIQUE: Multidetector CT imaging of the head and cervical spine was performed following the standard protocol without intravenous contrast. Multiplanar CT image reconstructions of the cervical spine were also generated. COMPARISON:  None FINDINGS: CT HEAD FINDINGS Brain: Mild generalized chest he atrophy. Normal ventricular morphology. No midline shift or mass effect. Otherwise normal appearance of brain parenchyma. No intracranial hemorrhage, mass lesion or evidence of acute infarction. No extra-axial fluid collections. Vascular: Minimal atherosclerotic calcification of internal carotid arteries at skull base. No hyperdense vessels. Skull: Intact Sinuses/Orbits: Clear Other: Mild nasal septal deviation to the RIGHT. CT CERVICAL SPINE FINDINGS Alignment: Normal Skull base and vertebrae: Visualized skull base intact. Osseous mineralization grossly normal. Disc space narrowing with endplate spur formation at C5-C6. Vertebral body and disc space heights otherwise maintained. No fracture or subluxation. Mild scattered facet degenerative changes. Uncovertebral spurs encroach upon the C5-C6 neural foramina bilaterally. Soft tissues and spinal canal: Prevertebral soft tissues normal thickness. Atherosclerotic  calcifications at the LEFT carotid bifurcation. Disc levels:  Mildly bulging C5-C6 disc. Upper chest: Tips of lung apices clear Other: N/A IMPRESSION: No acute  intracranial abnormalities. Degenerative disc disease changes at C5-C6. No acute cervical spine abnormalities. Electronically Signed   By: Ulyses SouthwardMark  Boles M.D.   On: 04/17/2018 20:06   Ct Cervical Spine Wo Contrast  Result Date: 04/17/2018 CLINICAL DATA:  Multiple syncopal episodes yesterday, generalized weakness, weakness in LEFT arm and RIGHT leg, history hypertension EXAM: CT HEAD WITHOUT CONTRAST CT CERVICAL SPINE WITHOUT CONTRAST TECHNIQUE: Multidetector CT imaging of the head and cervical spine was performed following the standard protocol without intravenous contrast. Multiplanar CT image reconstructions of the cervical spine were also generated. COMPARISON:  None FINDINGS: CT HEAD FINDINGS Brain: Mild generalized chest he atrophy. Normal ventricular morphology. No midline shift or mass effect. Otherwise normal appearance of brain parenchyma. No intracranial hemorrhage, mass lesion or evidence of acute infarction. No extra-axial fluid collections. Vascular: Minimal atherosclerotic calcification of internal carotid arteries at skull base. No hyperdense vessels. Skull: Intact Sinuses/Orbits: Clear Other: Mild nasal septal deviation to the RIGHT. CT CERVICAL SPINE FINDINGS Alignment: Normal Skull base and vertebrae: Visualized skull base intact. Osseous mineralization grossly normal. Disc space narrowing with endplate spur formation at C5-C6. Vertebral body and disc space heights otherwise maintained. No fracture or subluxation. Mild scattered facet degenerative changes. Uncovertebral spurs encroach upon the C5-C6 neural foramina bilaterally. Soft tissues and spinal canal: Prevertebral soft tissues normal thickness. Atherosclerotic calcifications at the LEFT carotid bifurcation. Disc levels:  Mildly bulging C5-C6 disc. Upper chest: Tips of lung apices clear Other: N/A IMPRESSION: No acute intracranial abnormalities. Degenerative disc disease changes at C5-C6. No acute cervical spine abnormalities. Electronically  Signed   By: Ulyses SouthwardMark  Boles M.D.   On: 04/17/2018 20:06     Medical Consultants:    None.  Anti-Infectives:   None  Subjective:    Benjamin Richard relates no complaints he feels great.  He is thirsty and hungry.  Objective:    Vitals:   04/19/18 0600 04/19/18 0615 04/19/18 0630 04/19/18 0715  BP: 139/83 (!) 139/96 (!) 133/117 137/89  Pulse: 88 62 69 62  Resp: 20 15 19 18   SpO2: 97% 100% 97% 93%    Intake/Output Summary (Last 24 hours) at 04/19/2018 0806 Last data filed at 04/19/2018 0358 Gross per 24 hour  Intake 445.67 ml  Output 250 ml  Net 195.67 ml   There were no vitals filed for this visit.  Exam: General exam: In no acute distress. Respiratory system: Good air movement and clear to auscultation. Cardiovascular system: S1 & S2 heard, RRR. Marland Kitchen.  Gastrointestinal system: Abdomen is nondistended, soft and nontender.  Central nervous system: Alert and oriented. No focal neurological deficits. Extremities: No pedal edema. Skin: No rashes, lesions or ulcers Psychiatry: Judgement and insight appear normal. Mood & affect appropriate.    Data Reviewed:    Labs: Basic Metabolic Panel: Recent Labs  Lab 04/17/18 1845 04/18/18 0436 04/19/18 0517  NA 126* 129* 136  K 3.2* 3.4* 3.3*  CL 89* 94* 104  CO2 23 22 23   GLUCOSE 108* 83 86  BUN 28* 28* 24*  CREATININE 2.12* 1.82* 1.35*  CALCIUM 8.3* 8.1* 8.2*  MG  --  1.4*  --    GFR Estimated Creatinine Clearance: 86.7 mL/min (A) (by C-G formula based on SCr of 1.35 mg/dL (H)). Liver Function Tests: Recent Labs  Lab 04/17/18 1845 04/18/18 0436 04/19/18 0517  AST 106* 95* 79*  ALT 75* 70* 61*  ALKPHOS 58 56 45  BILITOT 1.2 1.2 1.0  PROT 8.1 7.7 7.0  ALBUMIN 3.6 3.4* 3.3*   No results for input(s): LIPASE, AMYLASE in the last 168 hours. No results for input(s): AMMONIA in the last 168 hours. Coagulation profile Recent Labs  Lab 04/18/18 0436  INR 1.10    CBC: Recent Labs  Lab 04/17/18 1845  04/18/18 0436 04/19/18 0517  WBC 6.4 6.2 4.5  HGB 13.5 12.9* 11.1*  HCT 38.5* 38.4* 33.4*  MCV 89.1 89.9 93.6  PLT 139* 138* 114*   Cardiac Enzymes: No results for input(s): CKTOTAL, CKMB, CKMBINDEX, TROPONINI in the last 168 hours. BNP (last 3 results) No results for input(s): PROBNP in the last 8760 hours. CBG: Recent Labs  Lab 04/17/18 1845  GLUCAP 102*   D-Dimer: No results for input(s): DDIMER in the last 72 hours. Hgb A1c: No results for input(s): HGBA1C in the last 72 hours. Lipid Profile: No results for input(s): CHOL, HDL, LDLCALC, TRIG, CHOLHDL, LDLDIRECT in the last 72 hours. Thyroid function studies: No results for input(s): TSH, T4TOTAL, T3FREE, THYROIDAB in the last 72 hours.  Invalid input(s): FREET3 Anemia work up: No results for input(s): VITAMINB12, FOLATE, FERRITIN, TIBC, IRON, RETICCTPCT in the last 72 hours. Sepsis Labs: Recent Labs  Lab 04/17/18 1845 04/18/18 0436 04/19/18 0517  WBC 6.4 6.2 4.5   Microbiology No results found for this or any previous visit (from the past 240 hour(s)).   Medications:   . aspirin EC  81 mg Oral Daily  . enoxaparin (LOVENOX) injection  40 mg Subcutaneous Q24H  . gabapentin  300 mg Oral BID  . potassium chloride  20 mEq Oral Once  . potassium chloride  40 mEq Oral BID  . pravastatin  20 mg Oral Daily  . sertraline  50 mg Oral Daily   Continuous Infusions: . dexmedetomidine (PRECEDEX) IV infusion 0.3 mcg/kg/hr (04/19/18 0705)      LOS: 1 day   Marinda Elk  [facility]   *Please refer to [email or web address], password [email or web address] to get updated schedule on who will round on this patient, as hospitalists switch teams weekly. If 7PM-7AM, please contact night-coverage at [email or web address], password [email or web address] for any overnight needs.  04/19/2018, 8:06 AM

## 2018-04-19 NOTE — ED Notes (Signed)
Spoke to MRI. They will call "as soon as their ready."

## 2018-04-19 NOTE — ED Notes (Signed)
Spoke to MRI. Machine is going to be down for a couple hours. MRI will work on getting patient screened and getting in touch with emergency contact.

## 2018-04-19 NOTE — ED Notes (Signed)
Pt moved from ED stretcher to hospital bed. Pt able to follow commands at this time. Pt confused to events that occurred earlier in the night. Pt reoriented to person, place, and time.

## 2018-04-19 NOTE — ED Notes (Signed)
Paged pharmacy for another dose of PRECEDEX.

## 2018-04-20 LAB — BASIC METABOLIC PANEL
Anion gap: 8 (ref 5–15)
BUN: 16 mg/dL (ref 6–20)
CHLORIDE: 102 mmol/L (ref 98–111)
CO2: 25 mmol/L (ref 22–32)
CREATININE: 1.22 mg/dL (ref 0.61–1.24)
Calcium: 8.3 mg/dL — ABNORMAL LOW (ref 8.9–10.3)
GFR calc Af Amer: >60 mL/min (ref >60)
GFR calc non Af Amer: >60 mL/min (ref >60)
Glucose, Bld: 110 mg/dL — ABNORMAL HIGH (ref 70–99)
Potassium: 3.5 mmol/L (ref 3.5–5.1)
Sodium: 135 mmol/L (ref 135–145)

## 2018-04-20 LAB — VITAMIN B1: VITAMIN B1 (THIAMINE): 139.8 nmol/L (ref 66.5–200.0)

## 2018-04-20 MED ORDER — LIP MEDEX EX OINT
TOPICAL_OINTMENT | CUTANEOUS | Status: AC
  Administered 2018-04-20: 10:00:00
  Filled 2018-04-20: qty 7

## 2018-04-20 MED ORDER — AMLODIPINE BESYLATE 5 MG PO TABS
5.0000 mg | ORAL_TABLET | Freq: Every day | ORAL | Status: DC
  Administered 2018-04-20: 5 mg via ORAL
  Filled 2018-04-20: qty 1

## 2018-04-20 MED ORDER — HYDRALAZINE HCL 20 MG/ML IJ SOLN
10.0000 mg | Freq: Four times a day (QID) | INTRAMUSCULAR | Status: DC | PRN
  Filled 2018-04-20: qty 1

## 2018-04-20 NOTE — Progress Notes (Signed)
IVC rescinded by Dr. Margo Aye. LCSW faxed paperwork.   Beulah Gandy Rafael C[facility] Long CSW [phone]

## 2018-04-20 NOTE — Progress Notes (Signed)
Patient arrived to room 1505 at 2125 with belongings to include his wallet. Alert and oriented x4. Denies pain and/or shortness of breath. IVF's continued per order to PIV L forearm. Scattered bruising noted to BUE. Glasses in use. NT in to obtain VS's.

## 2018-04-20 NOTE — Progress Notes (Signed)
PROGRESS NOTE  Benjamin Richard LOV:[id number] DOB: 28-Oct-1960 DOA: 04/18/2018 PCP: Medicine, [facility] (Inactive)  HPI/Recap of past 24 hours: Benjamin Richard is an 58 y.o. male past medical history significant for alcohol abuse last drink on 04/08/2018 admitted to [facility] on the family practice teaching service on 04/17/2017, eloped on 04/18/2017 during this time he was being evaluated for a fall left foot drop and alcohol withdrawal brought in by the Police Department to [place] long in the ED the patient has been having auditory hallucinations.  He was trying to leave the hospital AGAINST MEDICAL ADVICE he was IVCD and put in four-point restraints.  04/20/2018: Patient seen and examined at bedside.  No acute events overnight.  He denies any auditory or visual hallucinations this morning.  He has no new complaints.  Assessment/Plan: Principal Problem:   Delirium tremens (HCC) Active Problems:   Hyponatremia   AKI (acute kidney injury) (HCC)   Hypokalemia   Dehydration   Alcohol withdrawal (HCC)  Delirium tremens with concern for alcohol withdrawal in the setting of chronic alcohol abuse Admitted to the ICU started on Ativan as needed.  No seizures in the hospital. Last Precedex dose was 04/19/2018  The patient is IVC. Continue to monitor for any changes Continue multivitamins, folic acid and thiamine Independently reviewed brain MRI which revealed no acute intracranial findings.  Acute kidney injury, improving: In the setting of NSAID use, ACE inhibitor and hydrochlorothiazide Presented with creatinine 2.12 Creatinine today 1.35 from 1.82 yesterday Continue to avoid nephrotoxic agents Continue to avoid dehydration Monitor urine output Obtain BMP  Recent hypokalemia Yesterday potassium 3.3 Currently being repleted with IV potassium in his IV fluids Obtain BMP this morning  Resolved hypovolemic hyponatremia improving with IV fluid hydration.  Essential  hypertension: Continue to hold hold lisinopril and hydrochlorothiazide resume metoprolol. Blood pressure currently uncontrolled Add Norvasc 5 mg daily Continue to monitor vital signs  Transaminitis: Likely due to alcohol abuse LFTs are trending down Normal alkaline phosphatase and normal total bilirubin  Normocytic anemia: Hemoglobin is stable Will need further work-up as an outpatient.   He cannot tell me whether he has had a colonoscopy at the age of 25.  DVT prophylaxis: lovenox Family Communication:none Disposition Plan/Barrier to D/C: unable to determine admit to ICU. Code Status:                      Objective: Vitals:   04/20/18 0724 04/20/18 0800 04/20/18 0816 04/20/18 0900  BP:  (!) 192/105 (!) 183/102 131/65  Pulse:  75 74 75  Resp:  (!) 23 20 18   Temp: 98.2 F (36.8 C)     TempSrc: Oral     SpO2:  96% 94% 93%  Weight:        Intake/Output Summary (Last 24 hours) at 04/20/2018 0932 Last data filed at 04/20/2018 0856 Gross per 24 hour  Intake 2423.43 ml  Output 1525 ml  Net 898.43 ml   Filed Weights   04/19/18 2303 04/20/18 0400  Weight: 120.2 kg 123.4 kg    Exam:  . General: 58 y.o. year-old male well developed well nourished in no acute distress.  Alert and interactive. . Cardiovascular: Regular rate and rhythm with no rubs or gallops.  No thyromegaly or JVD noted.   Marland Kitchen Respiratory: Clear to auscultation with no wheezes or rales. Good inspiratory effort. . Abdomen: Soft nontender nondistended with normal bowel sounds x4 quadrants. . Musculoskeletal: No lower extremity edema. 2/4 pulses in  all 4 extremities. . Skin: No ulcerative lesions noted or rashes, . Psychiatry: Mood is appropriate for condition and setting   Data Reviewed: CBC: Recent Labs  Lab 04/17/18 1845 04/18/18 0436 04/19/18 0517  WBC 6.4 6.2 4.5  HGB 13.5 12.9* 11.1*  HCT 38.5* 38.4* 33.4*  MCV 89.1 89.9 93.6  PLT 139* 138* 114*   Basic Metabolic Panel: Recent  Labs  Lab 04/17/18 1845 04/18/18 0436 04/19/18 0517  NA 126* 129* 136  K 3.2* 3.4* 3.3*  CL 89* 94* 104  CO2 23 22 23   GLUCOSE 108* 83 86  BUN 28* 28* 24*  CREATININE 2.12* 1.82* 1.35*  CALCIUM 8.3* 8.1* 8.2*  MG  --  1.4*  --    GFR: Estimated Creatinine Clearance: 87.8 mL/min (A) (by C-G formula based on SCr of 1.35 mg/dL (H)). Liver Function Tests: Recent Labs  Lab 04/17/18 1845 04/18/18 0436 04/19/18 0517  AST 106* 95* 79*  ALT 75* 70* 61*  ALKPHOS 58 56 45  BILITOT 1.2 1.2 1.0  PROT 8.1 7.7 7.0  ALBUMIN 3.6 3.4* 3.3*   No results for input(s): LIPASE, AMYLASE in the last 168 hours. No results for input(s): AMMONIA in the last 168 hours. Coagulation Profile: Recent Labs  Lab 04/18/18 0436  INR 1.10   Cardiac Enzymes: No results for input(s): CKTOTAL, CKMB, CKMBINDEX, TROPONINI in the last 168 hours. BNP (last 3 results) No results for input(s): PROBNP in the last 8760 hours. HbA1C: No results for input(s): HGBA1C in the last 72 hours. CBG: Recent Labs  Lab 04/17/18 1845  GLUCAP 102*   Lipid Profile: No results for input(s): CHOL, HDL, LDLCALC, TRIG, CHOLHDL, LDLDIRECT in the last 72 hours. Thyroid Function Tests: No results for input(s): TSH, T4TOTAL, FREET4, T3FREE, THYROIDAB in the last 72 hours. Anemia Panel: No results for input(s): VITAMINB12, FOLATE, FERRITIN, TIBC, IRON, RETICCTPCT in the last 72 hours. Urine analysis:    Component Value Date/Time   COLORURINE YELLOW 04/17/2018 2102   APPEARANCEUR HAZY (A) 04/17/2018 2102   LABSPEC 1.019 04/17/2018 2102   PHURINE 6.0 04/17/2018 2102   GLUCOSEU NEGATIVE 04/17/2018 2102   HGBUR SMALL (A) 04/17/2018 2102   BILIRUBINUR NEGATIVE 04/17/2018 2102   KETONESUR 5 (A) 04/17/2018 2102   PROTEINUR 100 (A) 04/17/2018 2102   NITRITE NEGATIVE 04/17/2018 2102   LEUKOCYTESUR NEGATIVE 04/17/2018 2102   Sepsis Labs: @LABRCNTIP (procalcitonin:4,lacticidven:4)  ) Recent Results (from the past 240  hour(s))  MRSA PCR Screening     Status: None   Collection Time: 04/19/18 10:25 PM  Result Value Ref Range Status   MRSA by PCR NEGATIVE NEGATIVE Final    Comment:        The GeneXpert MRSA Assay (FDA approved for NASAL specimens only), is one component of a comprehensive MRSA colonization surveillance program. It is not intended to diagnose MRSA infection nor to guide or monitor treatment for MRSA infections. Performed at [facility], [address] [place], [place] [address]       Studies: Mr Brain Wo Contrast  Result Date: 04/19/2018 CLINICAL DATA:  Alcohol abuse. Two recent falls. Right foot drop and alcohol withdrawal. EXAM: MRI HEAD WITHOUT CONTRAST TECHNIQUE: Multiplanar, multiecho pulse sequences of the brain and surrounding structures were obtained without intravenous contrast. COMPARISON:  Head CT 04/17/2018 FINDINGS: BRAIN: There is no acute infarct, acute hemorrhage, hydrocephalus or extra-axial collection. The midline structures are normal. No midline shift or other mass effect. There are no old infarcts. The white matter signal  is normal for the patient's age. Advanced atrophy for age. Susceptibility-sensitive sequences show no chronic microhemorrhage or superficial siderosis. VASCULAR: Major intracranial arterial and venous sinus flow voids are normal. SKULL AND UPPER CERVICAL SPINE: Calvarial bone marrow signal is normal. There is no skull base mass. Visualized upper cervical spine and soft tissues are normal. SINUSES/ORBITS: No fluid levels or advanced mucosal thickening. No mastoid or middle ear effusion. The orbits are normal. IMPRESSION: Advanced parenchymal atrophy for age.  No acute abnormality. Electronically Signed   By: Deatra Robinson M.D.   On: 04/19/2018 14:19    Scheduled Meds: . lip balm      . aspirin EC  81 mg Oral Daily  . enoxaparin (LOVENOX) injection  60 mg Subcutaneous Q24H  . folic acid  1 mg Oral Daily  . gabapentin  300 mg Oral  BID  . guaiFENesin  1,200 mg Oral BID  . metoprolol tartrate  25 mg Oral BID  . potassium chloride  20 mEq Oral Once  . pravastatin  20 mg Oral Daily  . sertraline  50 mg Oral Daily  . thiamine injection  100 mg Intravenous Daily    Continuous Infusions: . dexmedetomidine (PRECEDEX) IV infusion Stopped (04/19/18 2113)  . dextrose 5 % and 0.45 % NaCl with KCl 10 mEq/L 100 mL/hr at 04/20/18 0300     LOS: 2 days     Darlin Drop, MD [facility] Pager [phone]  If 7PM-7AM, please contact night-coverage [email or web address] Password [facility] 04/20/2018, 9:32 AM

## 2018-04-20 NOTE — Care Management Note (Signed)
Case Management Note  Patient Details  Name: Benjamin Richard MRN: [id number] Date of Birth: 1960-11-05  Subjective/Objective:                  Return to top of Delirium RRG - ISC  Discharge readiness is indicated by patient meeting Recovery Milestones, including ALL of the following: ? Delirium resolved or manageable[R] ? No dangerous behavior[S] fo 58 y.o. male past medical history significant for alcohol abuse last drink on 04/08/2018 admitted to [facility] on the family practice teaching service on 04/17/2017, eloped on 04/18/2017 during this time he was being evaluated for a fall left foot drop and alcohol withdrawal brought in by the Police Department to [place] long in the ED the patient has been having auditory hallucinations.  He was trying to leave the hospital AGAINST MEDICAL ADVICE he was IVCD and put in four-point restraints.r at least 24 hours ?  ? Thoughts of suicide or Harm absent or manageable at lower level of care n/a ? Medical comorbidities, adverse medication events, and substance use absent or treatable at lower level of care ? etoh abuse ? Functional status impairment absent or adequate self-care support planned at lower level of care =not at this time ? Patient can participate (eg, verify absence of plan for harm) in needed monitoring no ? Provider and supports sufficiently available at lower level of care   ? Not at this time ? Level of care-sdu   Action/Plan: Will follow for progression of care and clinical status. Will follow for case management needs none present at this time.  Expected Discharge Date:  (unknown)               Expected Discharge Plan:     In-House Referral:     Discharge planning Services     Post Acute Care Choice:    Choice offered to:     DME Arranged:    DME Agency:     HH Arranged:    HH Agency:     Status of Service:     If discussed at [employer], dates discussed:    Additional Comments:  Golda Acre, RN 04/20/2018, 9:00 AM

## 2018-04-21 LAB — CBC
HCT: 37.2 % — ABNORMAL LOW (ref 39.0–52.0)
Hemoglobin: 12.5 g/dL — ABNORMAL LOW (ref 13.0–17.0)
MCH: 30.7 pg (ref 26.0–34.0)
MCHC: 33.6 g/dL (ref 30.0–36.0)
MCV: 91.4 fL (ref 80.0–100.0)
Platelets: 169 10*3/uL (ref 150–400)
RBC: 4.07 MIL/uL — ABNORMAL LOW (ref 4.22–5.81)
RDW: 14 % (ref 11.5–15.5)
WBC: 4.2 10*3/uL (ref 4.0–10.5)
nRBC: 0 % (ref 0.0–0.2)

## 2018-04-21 LAB — BASIC METABOLIC PANEL
Anion gap: 10 (ref 5–15)
BUN: 12 mg/dL (ref 6–20)
CO2: 22 mmol/L (ref 22–32)
CREATININE: 1.15 mg/dL (ref 0.61–1.24)
Calcium: 8.5 mg/dL — ABNORMAL LOW (ref 8.9–10.3)
Chloride: 102 mmol/L (ref 98–111)
GFR calc Af Amer: >60 mL/min (ref >60)
GFR calc non Af Amer: >60 mL/min (ref >60)
Glucose, Bld: 90 mg/dL (ref 70–99)
Potassium: 3.5 mmol/L (ref 3.5–5.1)
Sodium: 134 mmol/L — ABNORMAL LOW (ref 135–145)

## 2018-04-21 MED ORDER — AMLODIPINE BESYLATE 10 MG PO TABS
10.0000 mg | ORAL_TABLET | Freq: Every day | ORAL | Status: DC
  Administered 2018-04-21: 10 mg via ORAL
  Filled 2018-04-21: qty 1

## 2018-04-21 MED ORDER — GABAPENTIN 300 MG PO CAPS: 300.0000 mg | ORAL_CAPSULE | Freq: Two times a day (BID) | ORAL | 0 refills | Status: AC

## 2018-04-21 MED ORDER — AMLODIPINE BESYLATE 10 MG PO TABS: 10.0000 mg | ORAL_TABLET | Freq: Every day | ORAL | 0 refills | Status: AC

## 2018-04-21 MED ORDER — FOLIC ACID 1 MG PO TABS: 1.0000 mg | ORAL_TABLET | Freq: Every day | ORAL | 0 refills | Status: AC

## 2018-04-21 MED ORDER — VITAMIN B-1 100 MG PO TABS: 100.0000 mg | ORAL_TABLET | Freq: Every day | ORAL | 0 refills | Status: AC

## 2018-04-21 NOTE — Discharge Summary (Signed)
Discharge Summary  Benjamin Richard:[id number] DOB: 03-Oct-1960  PCP: Medicine, [facility] (Inactive)  Admit date: 04/18/2018 Discharge date: 04/21/2018  Time spent: 35 minutes  Recommendations for Outpatient Follow-up:  1. Please completely abstain from alcohol use 2. Please follow-up with your PCP 3. Please take your medications as prescribed  Discharge Diagnoses:  Active Hospital Problems   Diagnosis Date Noted  . Delirium tremens (HCC) 04/18/2018  . Alcohol withdrawal (HCC) 04/18/2018  . Dehydration 04/18/2018  . Hypokalemia   . AKI (acute kidney injury) (HCC)   . Hyponatremia 04/17/2018    Resolved Hospital Problems  No resolved problems to display.    Discharge Condition: Stable  Diet recommendation: Resume previous diet  Vitals:   04/20/18 2134 04/21/18 0453  BP:  (!) 155/103  Pulse: 75 68  Resp: 18 18  Temp: 99 F (37.2 C) 98.7 F (37.1 C)  SpO2: 98% 98%    History of present illness:  Benjamin Richard an 58 y.o.malepast medical history significant for alcohol abuse last drink on 04/08/2018 admitted to [facility] on the family practice teaching service on 04/17/2017, eloped on 04/18/2017 during this time he was being evaluated for a fall left foot drop and alcohol withdrawal brought in by the [employer] to [place] long in the ED the patient has been having auditory hallucinations. He was trying to leave the hospital AGAINST MEDICAL ADVICE.  He was IVCD and put in four-point restraints.  He completed CIWA protocol.  Transferred to telemetry unit on 04/20/2018.  No acute issues.   04/21/2018: Patient seen and examined at his bedside.  No acute events overnight.  He has no new complaints.  He denies any auditory or visual hallucinations.  Hospital Course:  Principal Problem:   Delirium tremens (HCC) Active Problems:   Hyponatremia   AKI (acute kidney injury) (HCC)   Hypokalemia   Dehydration   Alcohol withdrawal (HCC)  Resolved  delirium tremens with concern for alcohol withdrawal in the setting of chronic alcohol abuse Admitted to the ICU started on Ativan as needed.No seizures in the hospital. Last Precedex dose was 04/19/2018  IVC was revoked on 04/20/2018. Denies suicidal or homicidal ideation. He wants to quit drinking alcohol Continue multivitamins, folic acid and thiamine Continue gabapentin 300 mg twice daily Independently reviewed brain MRI done on 04/19/2018 which revealed no acute intracranial findings.  Resolving acute kidney injury: In the setting of NSAID use, ACE inhibitor and hydrochlorothiazide Presented with creatinine 2.12 Fattening continues to improve at 1.1 from 1.22 from 1.35 from 1.82 Continue to avoid nephrotoxic agents Continue to avoid dehydration Continue to hold of lisinopril Follow-up with your PCP  Resolved hypokalemia post repletion Follow-up patient with your PCP  Resolving hypovolemic hyponatremia Avoid alcohol use Continue to hold off HCTZ  Essential hypertension: Continue to hold hold lisinopril and hydrochlorothiazide Continue home dose metoprolol 25 mg twice daily. Increased Norvasc dose to 10 mg daily Follow-up outpatient with your PCP  Transaminitis: Likely due to alcohol abuse LFTs are trending down Normal alkaline phosphatase and normal total bilirubin Follow-up outpatient with your PCP  Normocytic anemia, stable: Hemoglobin is stable at 12.5 from 11.1 No sign of overt bleeding  Alcohol abuse Case manager consulted to assist with resources at discharge Continue gabapentin   Code Status:Full code   Procedures:  None  Consultations:  None  Discharge Exam: BP (!) 155/103 (BP Location: Right Arm)   Pulse 68   Temp 98.7 F (37.1 C) (Oral)   Resp 18  Ht 6\' 5"  (1.956 m)   Wt 127.4 kg   SpO2 98%   BMI 33.31 kg/m  . General: 58 y.o. year-old male well developed well nourished in no acute distress.  Alert and oriented  x3. . Cardiovascular: Regular rate and rhythm with no rubs or gallops.  No thyromegaly or JVD noted.   Marland Kitchen. Respiratory: Clear to auscultation with no wheezes or rales. Good inspiratory effort. . Abdomen: Soft nontender nondistended with normal bowel sounds x4 quadrants. . Musculoskeletal: No lower extremity edema. 2/4 pulses in all 4 extremities. . Skin: No ulcerative lesions noted or rashes, . Psychiatry: Mood is appropriate for condition and setting  Discharge Instructions You were cared for by a hospitalist during your hospital stay. If you have any questions about your discharge medications or the care you received while you were in the hospital after you are discharged, you can call the unit and asked to speak with the hospitalist on call if the hospitalist that took care of you is not available. Once you are discharged, your primary care physician will handle any further medical issues. Please note that NO REFILLS for any discharge medications will be authorized once you are discharged, as it is imperative that you return to your primary care physician (or establish a relationship with a primary care physician if you do not have one) for your aftercare needs so that they can reassess your need for medications and monitor your lab values.   Allergies as of 04/21/2018   No Known Allergies     Medication List    STOP taking these medications   ibuprofen 200 MG tablet Commonly known as:  ADVIL,MOTRIN   lisinopril-hydrochlorothiazide 20-25 MG tablet Commonly known as:  PRINZIDE,ZESTORETIC     TAKE these medications   amLODipine 10 MG tablet Commonly known as:  NORVASC Take 1 tablet (10 mg total) by mouth daily. Start taking on:  April 22, 2018   aspirin EC 81 MG tablet Take 81 mg by mouth daily.   folic acid 1 MG tablet Commonly known as:  FOLVITE Take 1 tablet (1 mg total) by mouth daily. Start taking on:  April 22, 2018   gabapentin 300 MG capsule Commonly known as:   NEURONTIN Take 1 capsule (300 mg total) by mouth 2 (two) times daily.   hydrOXYzine 25 MG tablet Commonly known as:  ATARAX/VISTARIL Take 1 tablet (25 mg total) by mouth every 6 (six) hours as needed (anxiety/agitatio). What changed:  reasons to take this   metoprolol tartrate 25 MG tablet Commonly known as:  LOPRESSOR Take 25 mg by mouth 2 (two) times daily.   pravastatin 20 MG tablet Commonly known as:  PRAVACHOL Take 20 mg by mouth daily.   sertraline 50 MG tablet Commonly known as:  ZOLOFT Take 1 tablet (50 mg total) by mouth daily. For mood control   thiamine 100 MG tablet Commonly known as:  VITAMIN B-1 Take 1 tablet (100 mg total) by mouth daily.   traZODone 50 MG tablet Commonly known as:  DESYREL Take 50 mg by mouth at bedtime as needed for sleep.      No Known Allergies Follow-up Information    Medicine, [facility]. Call in 1 day(s).   Specialty:  Pediatric Gastroenterology Why:  Please call for post hospital follow-up appointment.       [facility]. Call in 1 day(s).   Why:  Please call for a post hospital follow-up appointment. Contact information: [address] [place] [address] [phone]           The results of significant diagnostics from this hospitalization (including imaging, microbiology, ancillary and laboratory) are listed below for reference.    Significant Diagnostic Studies: Ct Head Wo Contrast  Result Date: 04/17/2018 CLINICAL DATA:  Multiple syncopal episodes yesterday, generalized weakness, weakness in LEFT arm and RIGHT leg, history hypertension EXAM: CT HEAD WITHOUT CONTRAST CT CERVICAL SPINE WITHOUT CONTRAST TECHNIQUE: Multidetector CT imaging of the head and cervical spine was performed following the standard protocol without intravenous contrast. Multiplanar CT image reconstructions of the cervical spine were also generated. COMPARISON:  None FINDINGS: CT  HEAD FINDINGS Brain: Mild generalized chest he atrophy. Normal ventricular morphology. No midline shift or mass effect. Otherwise normal appearance of brain parenchyma. No intracranial hemorrhage, mass lesion or evidence of acute infarction. No extra-axial fluid collections. Vascular: Minimal atherosclerotic calcification of internal carotid arteries at skull base. No hyperdense vessels. Skull: Intact Sinuses/Orbits: Clear Other: Mild nasal septal deviation to the RIGHT. CT CERVICAL SPINE FINDINGS Alignment: Normal Skull base and vertebrae: Visualized skull base intact. Osseous mineralization grossly normal. Disc space narrowing with endplate spur formation at C5-C6. Vertebral body and disc space heights otherwise maintained. No fracture or subluxation. Mild scattered facet degenerative changes. Uncovertebral spurs encroach upon the C5-C6 neural foramina bilaterally. Soft tissues and spinal canal: Prevertebral soft tissues normal thickness. Atherosclerotic calcifications at the LEFT carotid bifurcation. Disc levels:  Mildly bulging C5-C6 disc. Upper chest: Tips of lung apices clear Other: N/A IMPRESSION: No acute intracranial abnormalities. Degenerative disc disease changes at C5-C6. No acute cervical spine abnormalities. Electronically Signed   By: Ulyses Southward M.D.   On: 04/17/2018 20:06   Ct Cervical Spine Wo Contrast  Result Date: 04/17/2018 CLINICAL DATA:  Multiple syncopal episodes yesterday, generalized weakness, weakness in LEFT arm and RIGHT leg, history hypertension EXAM: CT HEAD WITHOUT CONTRAST CT CERVICAL SPINE WITHOUT CONTRAST TECHNIQUE: Multidetector CT imaging of the head and cervical spine was performed following the standard protocol without intravenous contrast. Multiplanar CT image reconstructions of the cervical spine were also generated. COMPARISON:  None FINDINGS: CT HEAD FINDINGS Brain: Mild generalized chest he atrophy. Normal ventricular morphology. No midline shift or mass effect.  Otherwise normal appearance of brain parenchyma. No intracranial hemorrhage, mass lesion or evidence of acute infarction. No extra-axial fluid collections. Vascular: Minimal atherosclerotic calcification of internal carotid arteries at skull base. No hyperdense vessels. Skull: Intact Sinuses/Orbits: Clear Other: Mild nasal septal deviation to the RIGHT. CT CERVICAL SPINE FINDINGS Alignment: Normal Skull base and vertebrae: Visualized skull base intact. Osseous mineralization grossly normal. Disc space narrowing with endplate spur formation at C5-C6. Vertebral body and disc space heights otherwise maintained. No fracture or subluxation. Mild scattered facet degenerative changes. Uncovertebral spurs encroach upon the C5-C6 neural foramina bilaterally. Soft tissues and spinal canal: Prevertebral soft tissues normal thickness. Atherosclerotic calcifications at the LEFT carotid bifurcation. Disc levels:  Mildly bulging C5-C6 disc. Upper chest: Tips of lung apices clear Other: N/A IMPRESSION: No acute intracranial abnormalities. Degenerative disc disease changes at C5-C6. No acute cervical spine abnormalities. Electronically Signed   By: Ulyses Southward M.D.   On: 04/17/2018 20:06   Mr Brain Wo Contrast  Result Date: 04/19/2018 CLINICAL DATA:  Alcohol abuse. Two recent falls. Right foot drop and alcohol withdrawal. EXAM: MRI HEAD WITHOUT CONTRAST TECHNIQUE: Multiplanar, multiecho pulse sequences of the brain and surrounding structures were obtained without intravenous contrast. COMPARISON:  Head CT 04/17/2018 FINDINGS: BRAIN:  There is no acute infarct, acute hemorrhage, hydrocephalus or extra-axial collection. The midline structures are normal. No midline shift or other mass effect. There are no old infarcts. The white matter signal is normal for the patient's age. Advanced atrophy for age. Susceptibility-sensitive sequences show no chronic microhemorrhage or superficial siderosis. VASCULAR: Major intracranial arterial and  venous sinus flow voids are normal. SKULL AND UPPER CERVICAL SPINE: Calvarial bone marrow signal is normal. There is no skull base mass. Visualized upper cervical spine and soft tissues are normal. SINUSES/ORBITS: No fluid levels or advanced mucosal thickening. No mastoid or middle ear effusion. The orbits are normal. IMPRESSION: Advanced parenchymal atrophy for age.  No acute abnormality. Electronically Signed   By: Deatra RobinsonKevin  Herman M.D.   On: 04/19/2018 14:19    Microbiology: Recent Results (from the past 240 hour(s))  MRSA PCR Screening     Status: None   Collection Time: 04/19/18 10:25 PM  Result Value Ref Range Status   MRSA by PCR NEGATIVE NEGATIVE Final    Comment:        The GeneXpert MRSA Assay (FDA approved for NASAL specimens only), is one component of a comprehensive MRSA colonization surveillance program. It is not intended to diagnose MRSA infection nor to guide or monitor treatment for MRSA infections. Performed at [facility], [address] [place], [place] [id number]      Labs: Basic Metabolic Panel: Recent Labs  Lab 04/17/18 1845 04/18/18 0436 04/19/18 0517 04/20/18 1013 04/21/18 0623  NA 126* 129* 136 135 134*  K 3.2* 3.4* 3.3* 3.5 3.5  CL 89* 94* 104 102 102  CO2 23 22 23 25 22   GLUCOSE 108* 83 86 110* 90  BUN 28* 28* 24* 16 12  CREATININE 2.12* 1.82* 1.35* 1.22 1.15  CALCIUM 8.3* 8.1* 8.2* 8.3* 8.5*  MG  --  1.4*  --   --   --    Liver Function Tests: Recent Labs  Lab 04/17/18 1845 04/18/18 0436 04/19/18 0517  AST 106* 95* 79*  ALT 75* 70* 61*  ALKPHOS 58 56 45  BILITOT 1.2 1.2 1.0  PROT 8.1 7.7 7.0  ALBUMIN 3.6 3.4* 3.3*   No results for input(s): LIPASE, AMYLASE in the last 168 hours. No results for input(s): AMMONIA in the last 168 hours. CBC: Recent Labs  Lab 04/17/18 1845 04/18/18 0436 04/19/18 0517 04/21/18 0623  WBC 6.4 6.2 4.5 4.2  HGB 13.5 12.9* 11.1* 12.5*  HCT 38.5* 38.4* 33.4* 37.2*  MCV 89.1 89.9  93.6 91.4  PLT 139* 138* 114* 169   Cardiac Enzymes: No results for input(s): CKTOTAL, CKMB, CKMBINDEX, TROPONINI in the last 168 hours. BNP: BNP (last 3 results) No results for input(s): BNP in the last 8760 hours.  ProBNP (last 3 results) No results for input(s): PROBNP in the last 8760 hours.  CBG: Recent Labs  Lab 04/17/18 1845  GLUCAP 102*       Signed:  Darlin Droparole N Bayan Hedstrom, MD [facility] 04/21/2018, 11:21 AM

## 2018-04-21 NOTE — Progress Notes (Signed)
No formal consult.  RN called LCSW to notify that patient would be dc'ing and requesting SA resources.   LCSW met with patient at bedside and provided SA resources.   Carolin Coy [place]

## 2019-04-14 DEATH — deceased

## 2019-10-16 IMAGING — CT CT CERVICAL SPINE W/O CM
4 of 7 series · 13 of 33 positions shown, 14 images · non-contrast
Comparison: None

CLINICAL DATA: Multiple syncopal episodes yesterday, generalized
weakness, weakness in LEFT arm and RIGHT leg, history hypertension

EXAM:
CT HEAD WITHOUT CONTRAST
CT CERVICAL SPINE WITHOUT CONTRAST
TECHNIQUE: Multidetector CT imaging of the head and cervical spine was
performed following the standard protocol without intravenous
contrast. Multiplanar CT image reconstructions of the cervical spine
were also generated.

[Series 8: c_spine 2.0 st · axial · 0.32mm/px · z∈[-216,-102]mm · 4 of 96 slices shown, 5 images]
[im 20/96  soft-tissue]
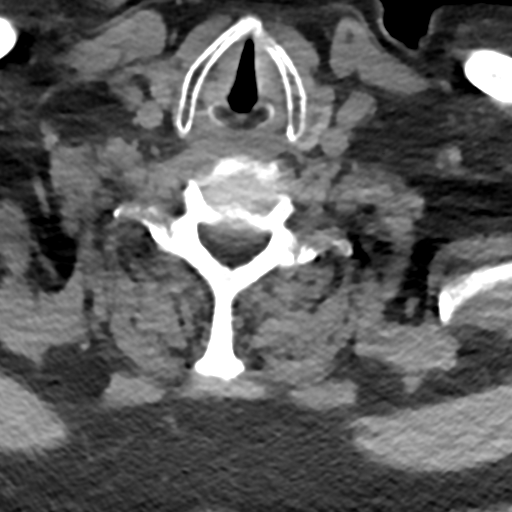
[im 20/96  bone]
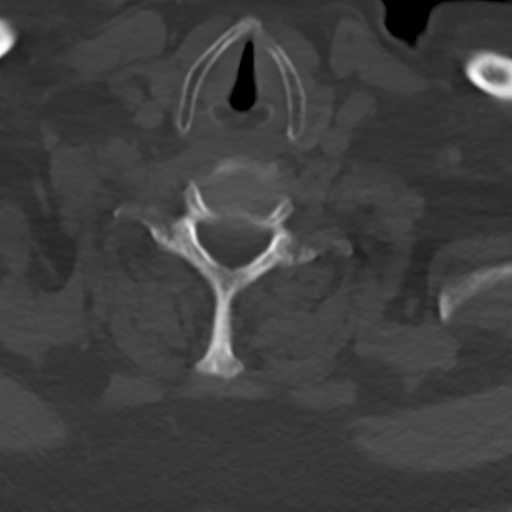
[im 39/96  bone]
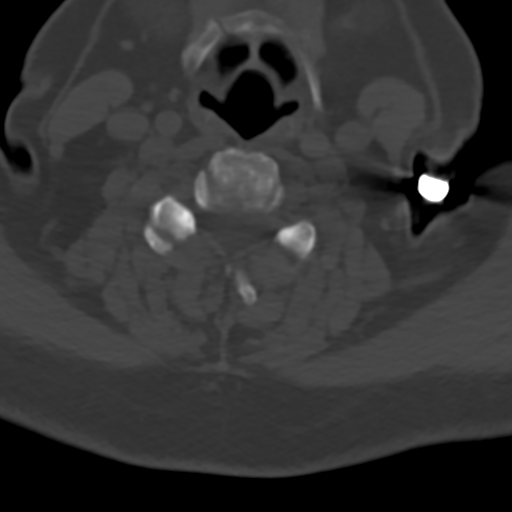
[im 58/96  bone]
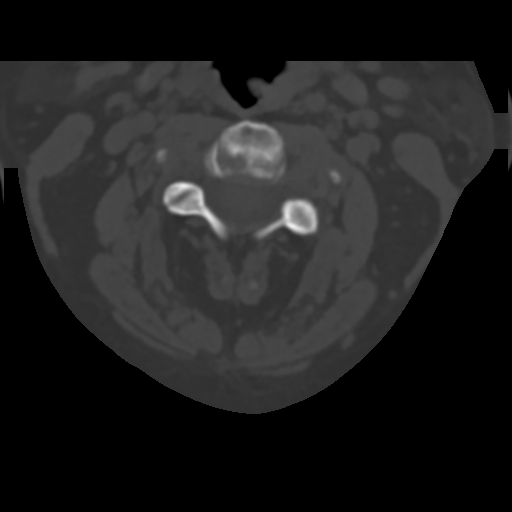
[im 77/96  bone]
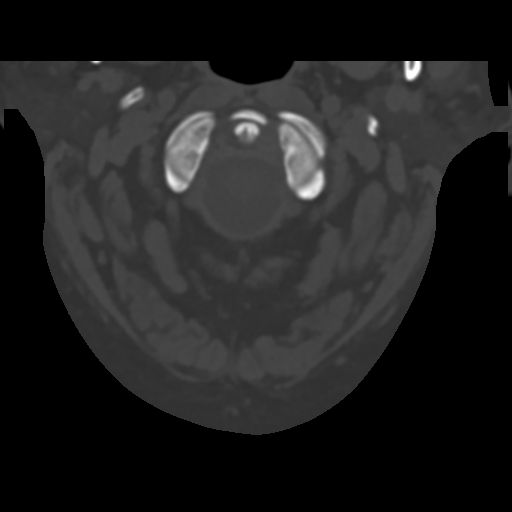

[Series 9: coronal bone · coronal · 0.23mm/px · 1 of 72 slices shown]
[im 36/72  bone]
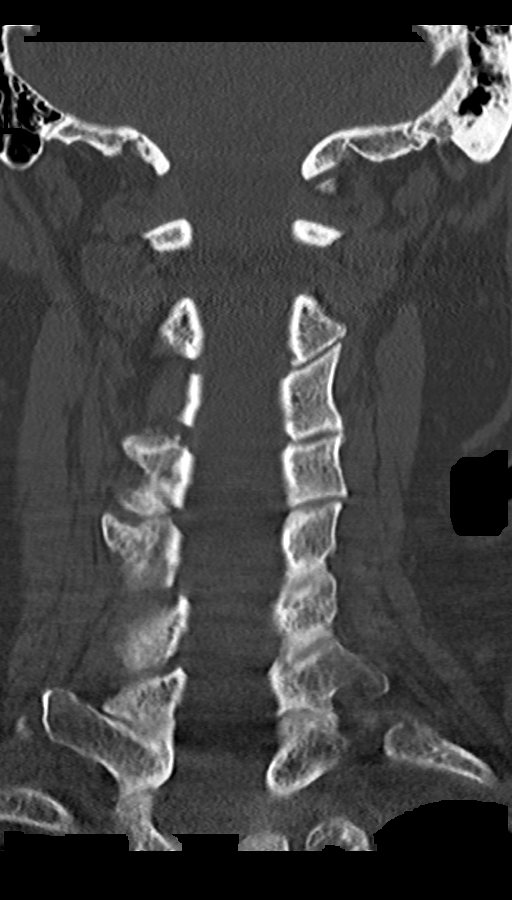

[Series 10: sagittal bone · sagittal · 0.34mm/px · 4 of 61 slices shown]
[im 13/61  bone]
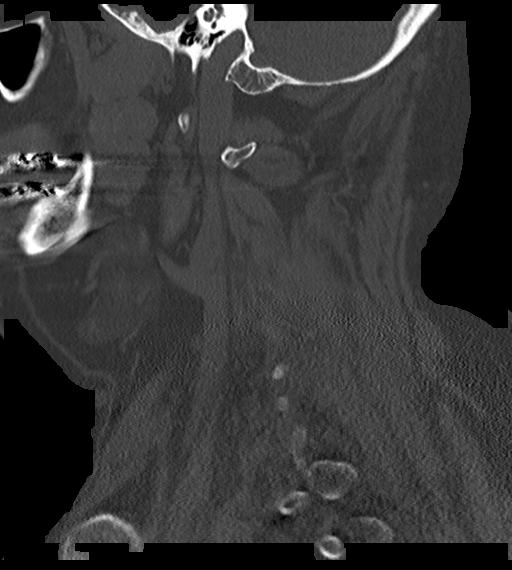
[im 25/61  bone]
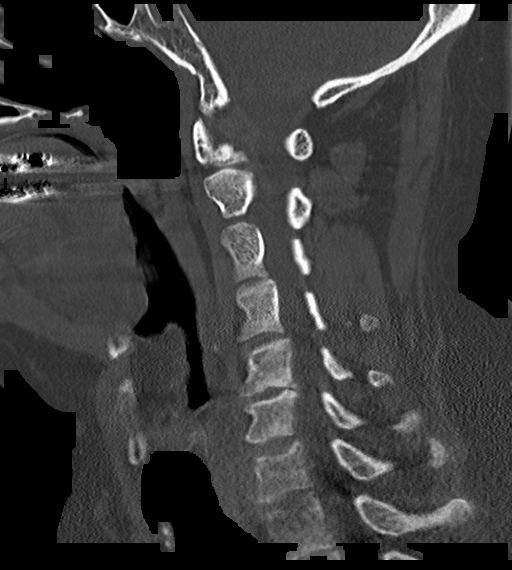
[im 37/61  bone]
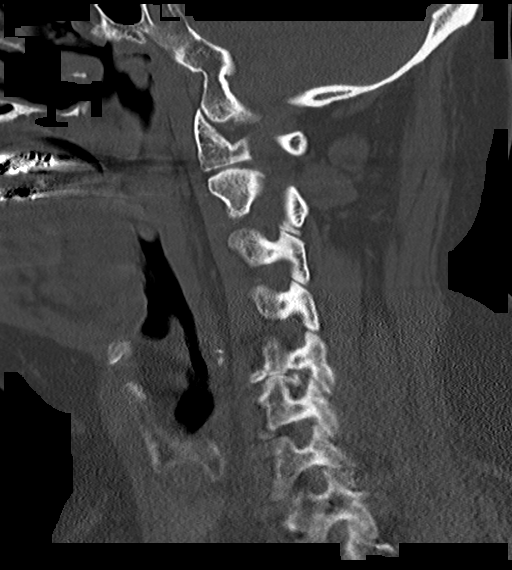
[im 49/61  bone]
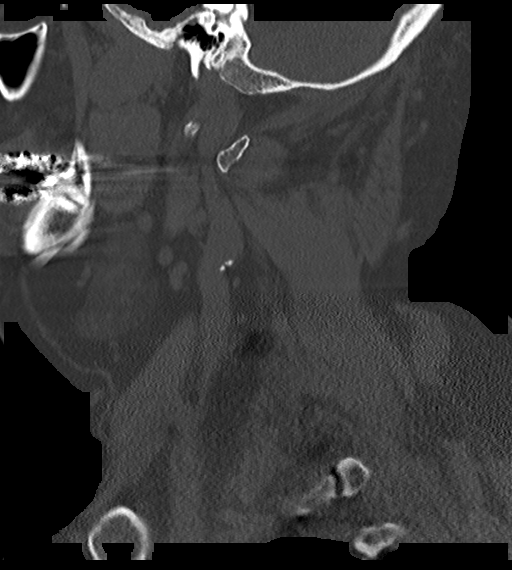

[Series 13: orthogonal axial st · axial · 0.21mm/px · z∈[-228,-134]mm · 4 of 86 slices shown]
[im 18/86  bone]
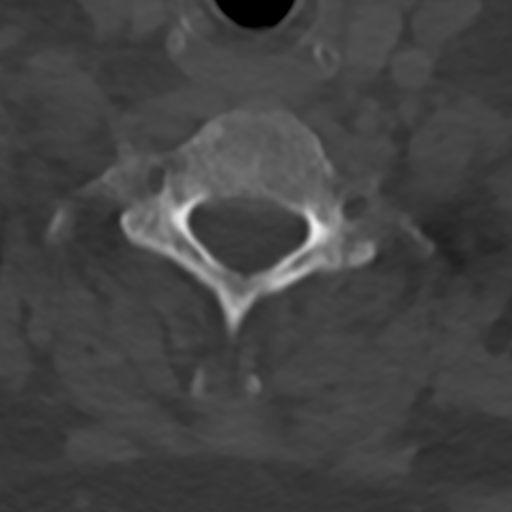
[im 35/86  bone]
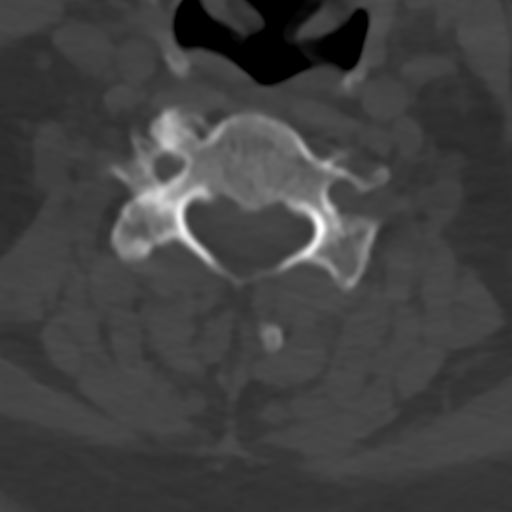
[im 52/86  bone]
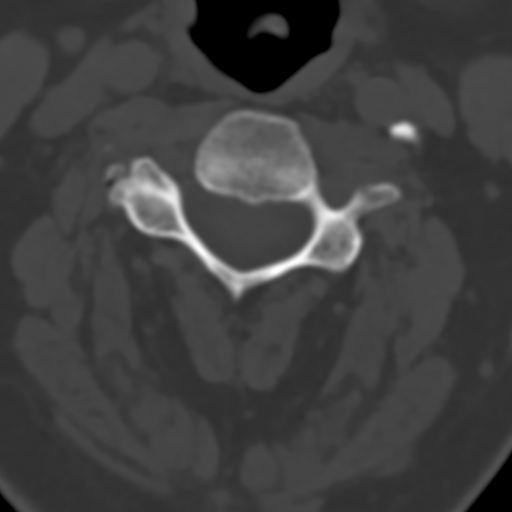
[im 69/86  bone]
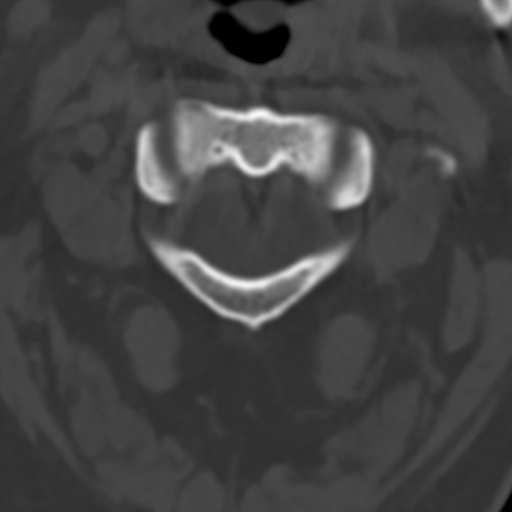

[13 of 33 positions shown; findings below may reference images not displayed]

FINDINGS: CT HEAD FINDINGS

Brain: Mild generalized chest he atrophy. Normal ventricular
morphology. No midline shift or mass effect. Otherwise normal
appearance of brain parenchyma. No intracranial hemorrhage, mass
lesion or evidence of acute infarction. No extra-axial fluid
collections.

Vascular: Minimal atherosclerotic calcification of internal carotid
arteries at skull base. No hyperdense vessels.

Skull: Intact

Sinuses/Orbits: Clear

Other: Mild nasal septal deviation to the RIGHT.

CT CERVICAL SPINE FINDINGS

Alignment: Normal

Skull base and vertebrae: Visualized skull base intact. Osseous
mineralization grossly normal. Disc space narrowing with endplate
spur formation at C5-C6. Vertebral body and disc space heights
otherwise maintained. No fracture or subluxation. Mild scattered
facet degenerative changes. Uncovertebral spurs encroach upon the
C5-C6 neural foramina bilaterally.

Soft tissues and spinal canal: Prevertebral soft tissues normal
thickness. Atherosclerotic calcifications at the LEFT carotid
bifurcation.

Disc levels:  Mildly bulging C5-C6 disc.

Upper chest: Tips of lung apices clear

Other: N/A
IMPRESSION: No acute intracranial abnormalities.

Degenerative disc disease changes at C5-C6.

No acute cervical spine abnormalities.
